# Patient Record
Sex: Female | Born: 1955 | Race: White | Hispanic: No | Marital: Single | Smoking: Never smoker
Health system: Southern US, Community
[De-identification: ages and names within clinical notes are randomized; demographics above are authoritative.]

## PROBLEM LIST (undated history)

## (undated) DIAGNOSIS — F32A Depression, unspecified: Secondary | ICD-10-CM

## (undated) DIAGNOSIS — F329 Major depressive disorder, single episode, unspecified: Secondary | ICD-10-CM

## (undated) HISTORY — PX: DILATION AND CURETTAGE OF UTERUS: SHX78

## (undated) HISTORY — PX: GASTRIC BYPASS: SHX52

## (undated) HISTORY — PX: TONSILLECTOMY: SUR1361

## (undated) HISTORY — PX: REDUCTION MAMMAPLASTY: SUR839

## (undated) HISTORY — PX: ABDOMINAL HYSTERECTOMY: SHX81

## (undated) NOTE — ED Provider Notes (Signed)
 Formatting of this note is different from the original. Emergency Department Note  Message for Patient/Family:  These medical records are being shared as per CMS regulations to improve transparency and understanding. However, these notes have medical terminology and technical language to communicate with other providers, insurance companies, and the billing department.   History   Refer to MDM for HPI  PCP: Per Patient, No Pcp, MD   History reviewed. No pertinent past medical history.  History reviewed. No pertinent surgical history.  Patient's family history was reviewed.  Physical Exam   ED Triage Vitals  Enc Vitals Group     BP 08/15/22 1720 (!) 140/75     Heart Rate 08/15/22 1720 72     Resp 08/15/22 1720 18     Temp 08/15/22 1720 97.5 F (36.4 C)     Temp Source 08/15/22 1720 Temporal     SpO2 08/15/22 1720 95 %     Weight 08/15/22 1718 86.4 kg (190 lb 7.6 oz)     Height 08/15/22 1718 1.676 m (5' 6)     Head Circumference --      Peak Flow --      Pain Score --      Pain Loc --      Pain Edu? --      Excl. in GC? --    Vitals:   08/15/22 1800 08/15/22 1830 08/15/22 1853 08/15/22 1940  BP: 124/80 102/89 126/87 133/78  Patient Position:      Pulse: 68 70  70  Resp: 18 18  18   Temp:    97.5 F (36.4 C)  TempSrc:    Oral  SpO2: 98% 98% 97% 96%  Weight:      Height:       Physical Exam Vitals and nursing note reviewed.  Constitutional:      General: She is not in acute distress.    Appearance: Normal appearance. She is not ill-appearing, toxic-appearing or diaphoretic.  HENT:     Head: Normocephalic and atraumatic.     Nose: No rhinorrhea.  Eyes:     General: No scleral icterus.       Right eye: No discharge.        Left eye: No discharge.     Extraocular Movements: Extraocular movements intact.     Conjunctiva/sclera: Conjunctivae normal.  Neck:     Comments: No midline C-spine tenderness to palpation Cardiovascular:     Rate and Rhythm: Normal  rate and regular rhythm.     Heart sounds: No murmur heard.    No friction rub. No gallop.  Pulmonary:     Effort: Pulmonary effort is normal. No respiratory distress.     Breath sounds: No stridor. No wheezing, rhonchi or rales.  Chest:     Chest wall: No tenderness.  Abdominal:     General: There is no distension.     Palpations: Abdomen is soft.     Tenderness: There is no abdominal tenderness. There is no guarding or rebound.     Hernia: No hernia is present.  Musculoskeletal:        General: Normal range of motion.     Cervical back: Normal range of motion.     Comments: Full range of motion of bilateral upper and lower extremities.  Patient does have some mild tenderness and swelling noted to right dorsal forearm.  Skin:    General: Skin is warm and dry.     Capillary Refill: Capillary refill takes  less than 2 seconds.  Neurological:     General: No focal deficit present.     Mental Status: She is alert.   ED Course  ED ASSESSMENT: I provided the following treatments and reviewed the following labs and imaging studies.  ED Medications:  Patient Medications to Start:  Medications   HYDROcodone-acetaminophen  (Norco) 5-325 MG tablet    Sig: Take 1 tablet by mouth every 6 (six) hours as needed    Dispense:  8 tablet    Refill:  0   Labs: No results found for this visit on 08/15/22.  Radiology: CT Head/Brain Wo Contrast  Final Result  No acute intracranial abnormality.   Leafy Mulder, MD  Electronically Signed: 08/15/2022 6:51 PM   X-ray Wrist right 3 views  Final Result  Minimally displaced fracture of the distal radius with soft tissue edema about the wrist.   Stanislav I. Mcneil, MD  Electronically Signed: 08/15/2022 6:01 PM        MDM   History obtained from: patient   Differential diagnoses includes, not limited to : Fracture, muscular injury, ligamentous injury, intracranial hemorrhage less likely  10 y.o. female presenting to the ED with  mechanical fall earlier today, was bumped at the [place] airport and fell fowrard. Hit her head, bruising noted to jaw but no pain now. Mainly just having pain of R wrist.   No LOC, vomiting. No numbness or weakness in arms or legs.   ED Course: Pt was a trauma alert, CT head obtained as well as xr of right wrist Clears nexus criteria for cspine XR showed acute fracture, placed in volar short arm splint. CT Head showed no acute findings, will plan to discharge  Diagnosis: Wrist fracture  Discharge Medications: Discharge Medication List as of 08/15/2022  7:37 PM    START taking these medications   Details  HYDROcodone-acetaminophen  (Norco) 5-325 MG tablet Take 1 tablet by mouth every 6 (six) hours as needed, Starting Thu 08/15/2022, Until Thu 08/22/2022 at 2359, Normal     Clinical Impression   Diagnosis     Diagnosis Comment Added By Time Added   Closed fracture of distal end of right radius, unspecified fracture morphology, initial encounter  Catharine Battiest, MD 08/15/2022  7:29 PM     ED Dispo  Discharge Discharge  Follow-Up/Referrals: your orthopedic doctor  In 1 week  This note was dictated with the use of Dragon software and is subject to voice recognition errors.  Catharine Battiest, MD Physician 08/23/22 1332  Electronically signed by Catharine Battiest, MD at 08/23/2022  1:32 PM CDT

---

## 1998-06-02 ENCOUNTER — Ambulatory Visit (HOSPITAL_BASED_OUTPATIENT_CLINIC_OR_DEPARTMENT_OTHER): Admission: RE | Admit: 1998-06-02 | Discharge: 1998-06-02 | Payer: Self-pay | Admitting: Orthopedic Surgery

## 1998-08-11 ENCOUNTER — Emergency Department (HOSPITAL_COMMUNITY): Admission: EM | Admit: 1998-08-11 | Discharge: 1998-08-11 | Payer: Self-pay | Admitting: Emergency Medicine

## 2001-11-10 ENCOUNTER — Other Ambulatory Visit: Admission: RE | Admit: 2001-11-10 | Discharge: 2001-11-10 | Payer: Self-pay | Admitting: *Deleted

## 2006-01-14 HISTORY — PX: ERCP: SHX60

## 2006-01-14 HISTORY — PX: GASTRIC BYPASS: SHX52

## 2006-09-19 ENCOUNTER — Encounter: Admission: RE | Admit: 2006-09-19 | Discharge: 2006-09-19 | Payer: Self-pay | Admitting: General Surgery

## 2006-12-15 ENCOUNTER — Inpatient Hospital Stay (HOSPITAL_COMMUNITY): Admission: RE | Admit: 2006-12-15 | Discharge: 2006-12-17 | Payer: Self-pay | Admitting: General Surgery

## 2006-12-16 ENCOUNTER — Ambulatory Visit: Payer: Self-pay | Admitting: Vascular Surgery

## 2006-12-22 ENCOUNTER — Encounter: Admission: RE | Admit: 2006-12-22 | Discharge: 2007-03-22 | Payer: Self-pay | Admitting: General Surgery

## 2007-01-15 HISTORY — PX: CHOLECYSTECTOMY: SHX55

## 2007-05-21 ENCOUNTER — Encounter: Admission: RE | Admit: 2007-05-21 | Discharge: 2007-05-21 | Payer: Self-pay | Admitting: General Surgery

## 2007-10-15 ENCOUNTER — Emergency Department (HOSPITAL_COMMUNITY): Admission: EM | Admit: 2007-10-15 | Discharge: 2007-10-15 | Payer: Self-pay | Admitting: Emergency Medicine

## 2007-10-16 ENCOUNTER — Observation Stay (HOSPITAL_COMMUNITY): Admission: AD | Admit: 2007-10-16 | Discharge: 2007-10-18 | Payer: Self-pay | Admitting: General Surgery

## 2007-10-17 ENCOUNTER — Encounter (INDEPENDENT_AMBULATORY_CARE_PROVIDER_SITE_OTHER): Payer: Self-pay | Admitting: Surgery

## 2009-01-25 IMAGING — CR DG CHEST 2V
2 series · 2 of 2 positions shown · non-contrast
Comparison: No priors for comparison.

CLINICAL DATA: Morbid obesity.  HBP.  Preop workup.
CHEST - 2 VIEW:

[w chest pa *]
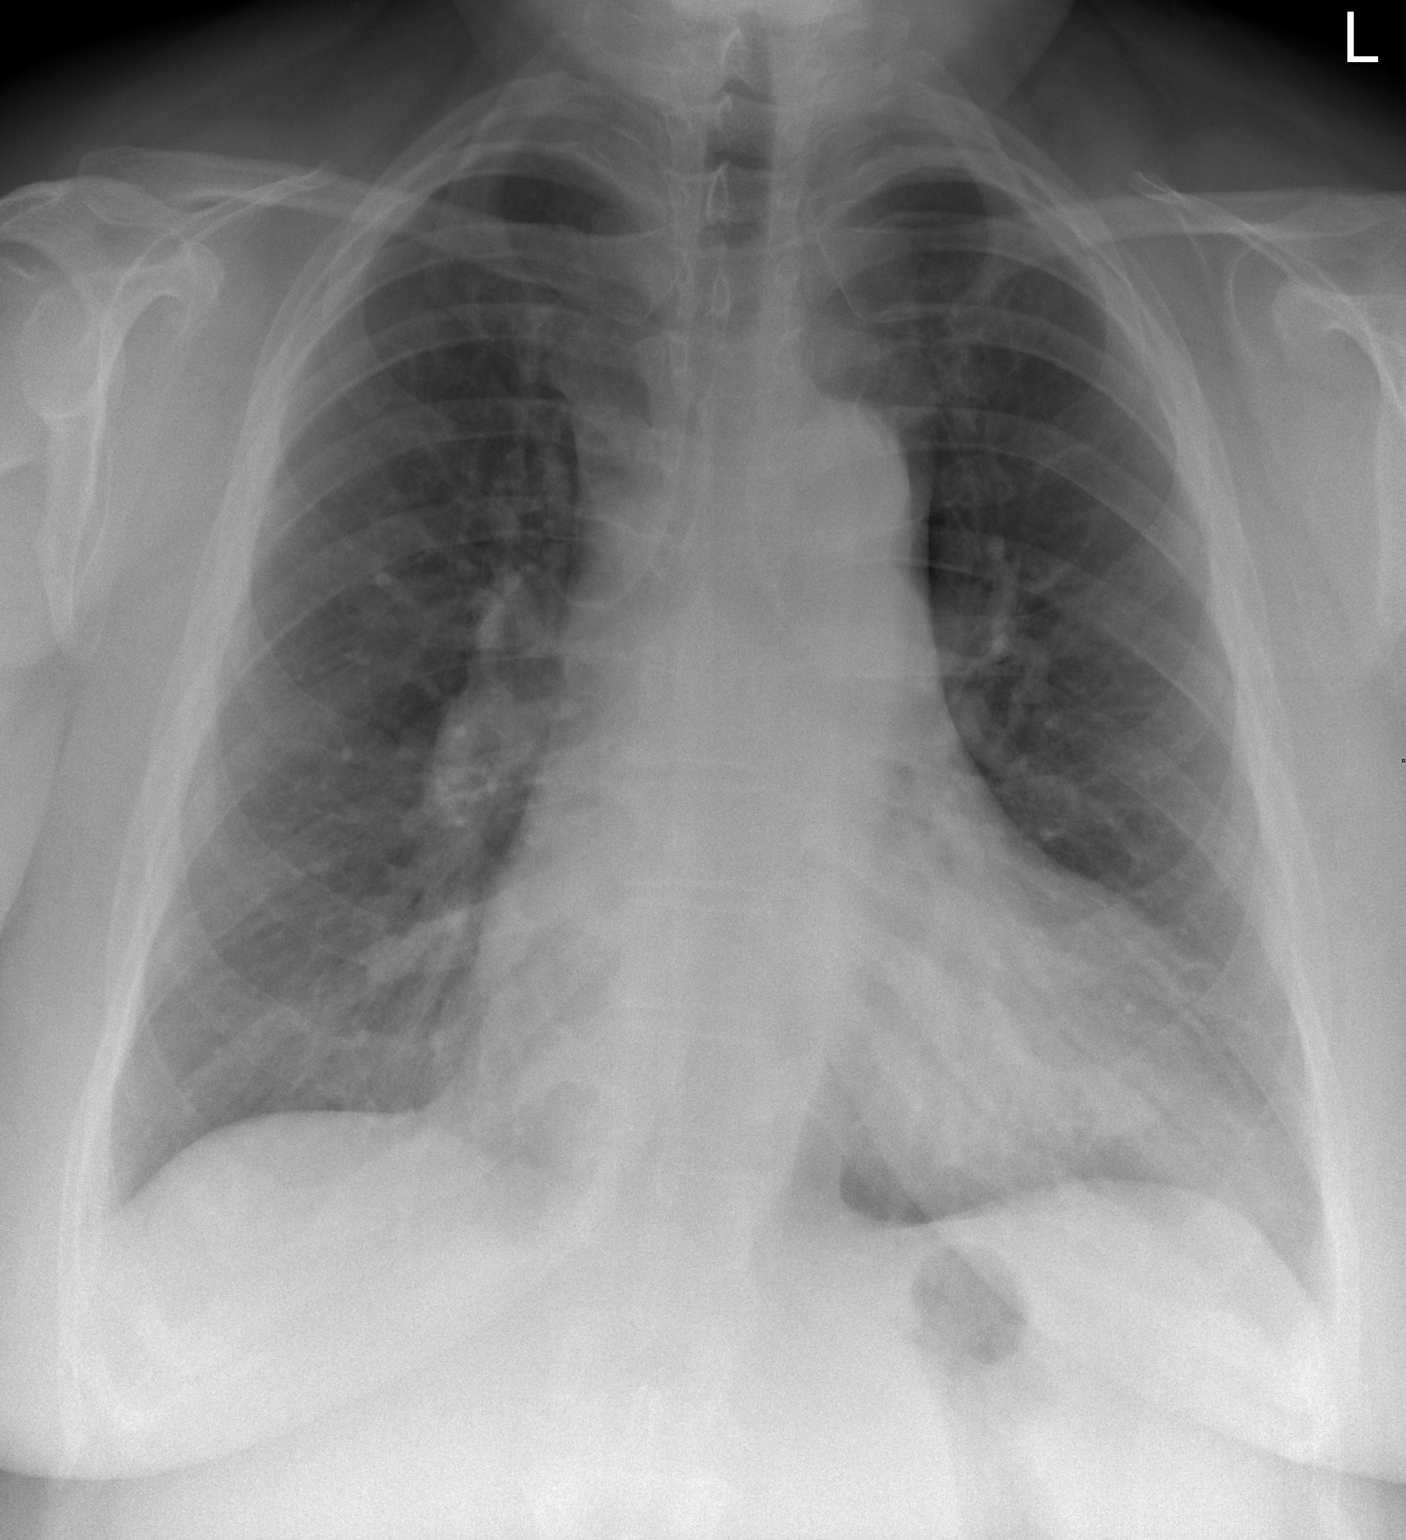

[w chest lat *]
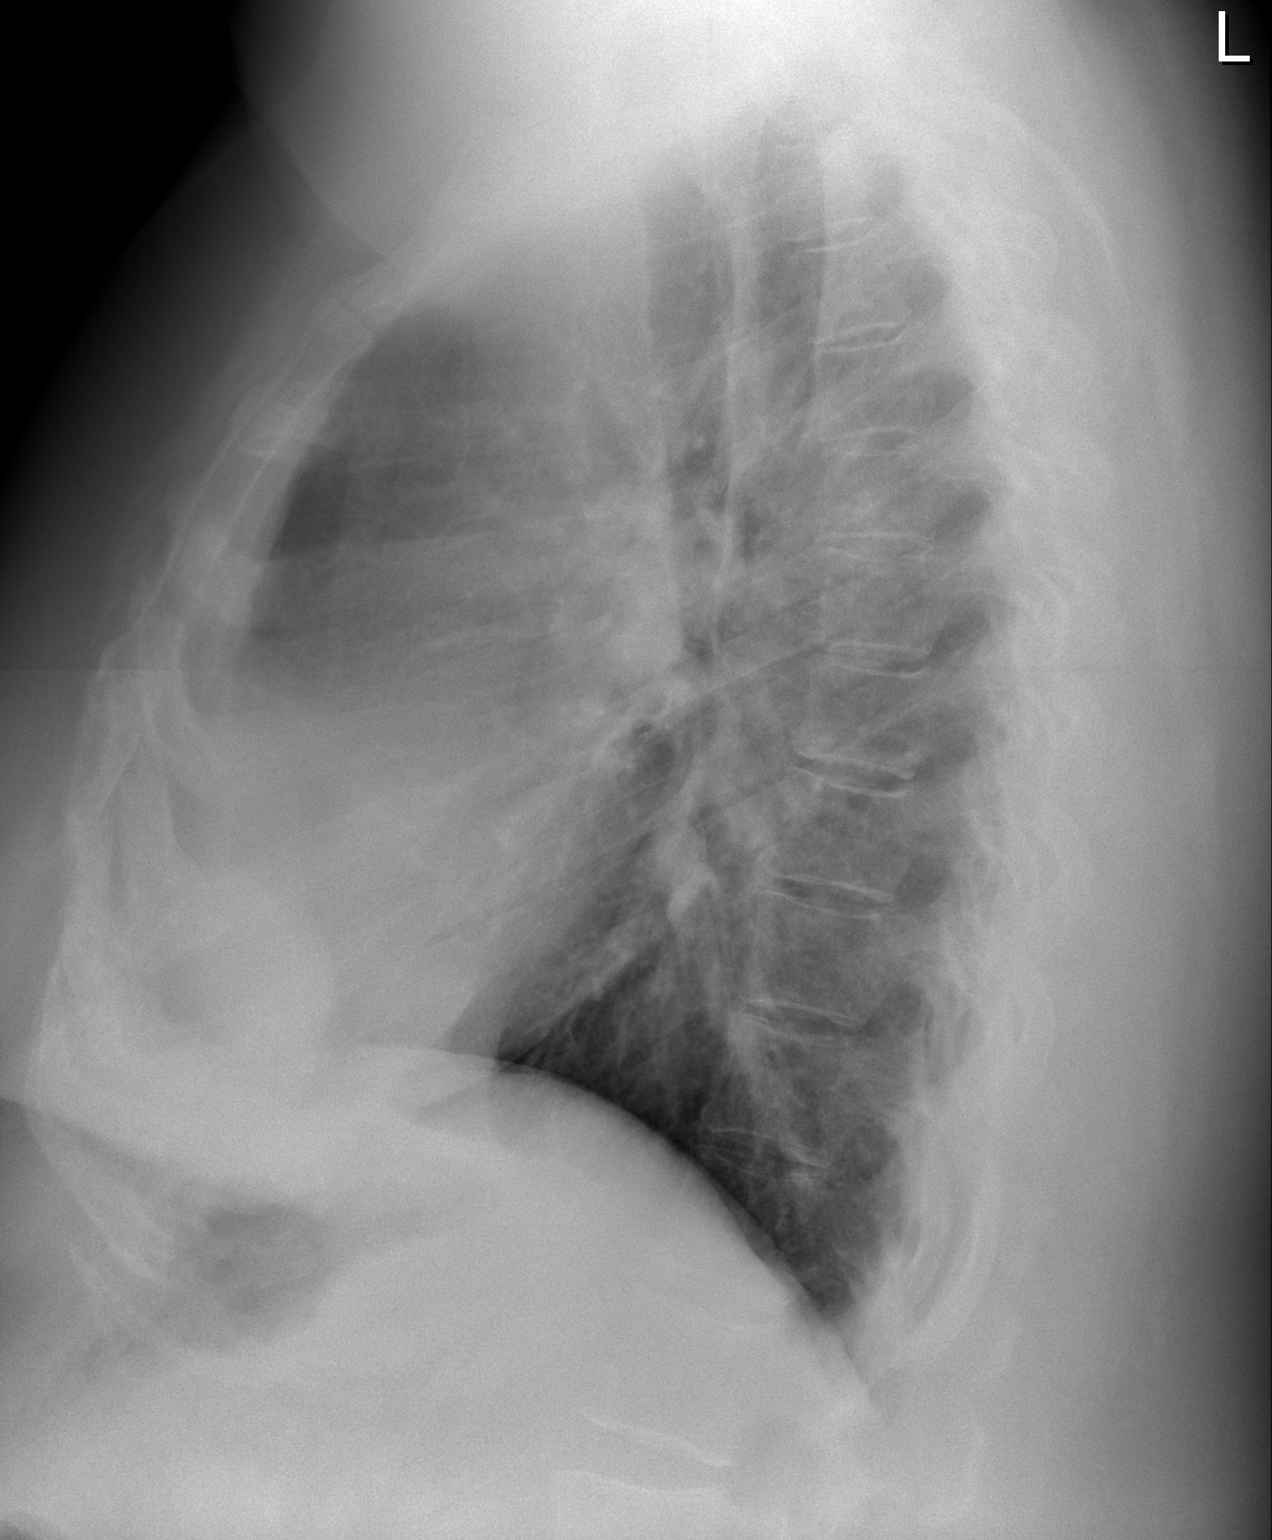

[2 of 2 positions shown; findings below may reference images not displayed]

FINDINGS: Cardiomegaly.  Redistribution of pulmonary blood flow of the upper lung to the upper lung zones compatible with pulmonary venous hypertension.  No pulmonary edema.  Lungs moderately hyperaerated.
IMPRESSION: Cardiomegaly and pulmonary venous hypertension.  Hyperaerated lungs.

## 2009-02-14 HISTORY — PX: HIP ARTHROPLASTY: SHX981

## 2009-03-03 ENCOUNTER — Inpatient Hospital Stay (HOSPITAL_COMMUNITY): Admission: EM | Admit: 2009-03-03 | Discharge: 2009-03-04 | Payer: Self-pay | Admitting: Emergency Medicine

## 2010-04-04 LAB — DIFFERENTIAL
Eosinophils Relative: 1 % (ref 0–5)
Lymphocytes Relative: 26 % (ref 12–46)
Lymphs Abs: 1.7 10*3/uL (ref 0.7–4.0)
Neutro Abs: 4.2 10*3/uL (ref 1.7–7.7)

## 2010-04-04 LAB — URINALYSIS, ROUTINE W REFLEX MICROSCOPIC
Glucose, UA: NEGATIVE mg/dL
Hgb urine dipstick: NEGATIVE
Nitrite: NEGATIVE
Protein, ur: NEGATIVE mg/dL
Urobilinogen, UA: 0.2 mg/dL (ref 0.0–1.0)

## 2010-04-04 LAB — APTT: aPTT: 32 seconds (ref 24–37)

## 2010-04-04 LAB — POCT I-STAT, CHEM 8
BUN: 11 mg/dL (ref 6–23)
Calcium, Ion: 1.17 mmol/L (ref 1.12–1.32)
Chloride: 106 mEq/L (ref 96–112)
Hemoglobin: 15 g/dL (ref 12.0–15.0)
Potassium: 3.6 mEq/L (ref 3.5–5.1)
TCO2: 28 mmol/L (ref 0–100)

## 2010-04-04 LAB — BASIC METABOLIC PANEL
BUN: 7 mg/dL (ref 6–23)
Calcium: 9 mg/dL (ref 8.4–10.5)
Creatinine, Ser: 0.47 mg/dL (ref 0.4–1.2)
GFR calc Af Amer: >60 mL/min (ref >60)
GFR calc non Af Amer: >60 mL/min (ref >60)

## 2010-04-04 LAB — CBC
Hemoglobin: 15 g/dL (ref 12.0–15.0)
MCHC: 34.8 g/dL (ref 30.0–36.0)
Platelets: 256 10*3/uL (ref 150–400)
RBC: 4.33 MIL/uL (ref 3.87–5.11)
RBC: 5.04 MIL/uL (ref 3.87–5.11)
WBC: 9.3 10*3/uL (ref 4.0–10.5)

## 2010-04-04 LAB — PROTIME-INR: Prothrombin Time: 13.2 seconds (ref 11.6–15.2)

## 2010-04-04 LAB — GLUCOSE, CAPILLARY: Glucose-Capillary: 101 mg/dL — ABNORMAL HIGH (ref 70–99)

## 2010-05-29 NOTE — Op Note (Signed)
NAMEBRITTIANY, Hannah Cabrera             ACCOUNT NO.:  [id number]   MEDICAL RECORD NO.:  [id number]          PATIENT TYPE:  INP   LOCATION:  0003                         FACILITY:  [facility]   PHYSICIAN:  Sharlet Salina T. Hoxworth, M.D.DATE OF BIRTH:  1955/11/03   DATE OF PROCEDURE:  12/15/2006  DATE OF DISCHARGE:                               OPERATIVE REPORT   PREOPERATIVE DIAGNOSES:  Morbid obesity.   POSTOPERATIVE DIAGNOSIS:  Morbid obesity.   SURGICAL PROCEDURES:  Laparoscopic Roux-en-Y gastric bypass.   SURGEON:  Lorne Skeens. Hoxworth, M.D.   ASSISTANT:  Sandria Bales. Ezzard Standing, M.D.   ANESTHESIA:  General.   BRIEF HISTORY:  Hannah Cabrera is a 55 year old female with a long history  of progressive morbid obesity and multiple comorbidities unresponsive to  medical management.  Following extensive discussion detailed elsewhere,  we have elected proceed with laparoscopic Roux-en-Y gastric bypass.  The  patient was brought to the operating room for this procedure.   DESCRIPTION OF OPERATION:  The patient brought to the operating room,  placed in the supine position on the operating table and general  endotracheal anesthesia was induced.  She had undergone mechanical  antibiotic bowel prep.  Lovenox 40 mg had been given subcutaneously.  Preoperative antibiotics were administered.  PAS were placed.  The  abdomen was widely sterilely prepped and draped.  Local anesthesia was  used to infiltrate the trocar sites.  Abdominal access was obtained with  11 mm OptiVu trocar in the left subcostal space and pneumoperitoneum  established.  Under direct vision the 11 mm trocar was placed in the  right subcostal space to center the falciform ligament, another in the  right upper mid abdomen, another in the left upper mid abdomen for the  camera port and a 5 mm trocar placed in the left flank.  The patient had  a previous TAH-BSO and there were omental adhesions to the anterior  abdominal wall that were taken down  without difficulty with the harmonic  scalpel.  The omentum and transverse colon were elevated.  The ligament  of Treitz clearly identified.  A 40 cm afferent limb was measured and  the small bowel divided at this point with a single firing of the white  load 45 mm stapler.  The mesentery was further mobilized a short  distance with the harmonic scalpel.  The end of the Roux limb was marked  suturing a Penrose drain to it and then a 100 cm Roux limb was carefully  measured.  At this point a side-to-side jejunojejunostomy was created  with a single firing of the white load 45 mm stapler.  The staple line  was inspected and was intact without bleeding.  The common enterotomy  was closed with running 2-0 Vicryl beginning at either end of the  enterotomy and tied centrally.  The mesenteric defect was closed with a  running 2-0 silk.  Tisseel had been used to coat the staple and the  suture lines.  At this point the patient was placed in reverse  Trendelenburg and through a 5-mm site subxiphoid a liver retractor was  placed and the  left lobe of the liver elevated.  The liver was large and  somewhat floppy but adequate exposure was obtained with good  visualization of the hiatus of her stomach.  The angle of His was  mobilized with harmonic scalpel.  An approximately 4 to 5 cm the pouch  was then measured along the lesser curve and the lesser curve was  dissected along the gastric wall back toward the lesser sac with the  harmonic scalpel and then the free lesser sac entered without  difficulty.  All tubes were assured to be removed from the stomach and  initial firing of the gold 60 mm echelon stapler was used at right-angle  along the lesser curve about 40 cm.  Two additional firings of the blue  load stapler were then used creating a tubular pouch up through the  angle of His and the stomach was seen to be completely divided.  The  gastric remnant staple line was oversewn with running 2-0  silk.  Tisseel  sealant was used to the coat the upper portion of the staple line of the  pouch.  The omentum was divided with the harmonic scalpel to allow the  Roux limb to come up more easily under no tension which it did very  nicely.  The Roux limb was brought up and initial posterior row of  running 2-0 Vicryl was placed between the Roux limb of the gastric  pouch.  Enterotomies were then created in the pouch of the Roux limb  with harmonic scalpel and approximately 2 cm anastomosis was then  created with a single firing of the blue load 45-mm linear stapler.  Staple line was inspected and was intact without bleeding.  The common  enterotomy was then closed beginning at either end with a running 2-0  Vicryl.  The Ewald tube was then passed back down through the  anastomosis and an outer layer of seromuscular Lembert running 2-0  Vicryl was placed.  The anastomosis appeared to be under no tension with  very good blood supply.  Following this with the gastric outlet clamped  and the anastomosis under saline irrigation, Dr. Ezzard Standing performed  intraoperative endoscopy and with the pouch tightly distended with air  there was no evidence of leak.  The pouch was decompressed.  Complete  hemostasis was assured.  The abdomen irrigated and suctioned.  Tisseel  tissue sealant was used to cut the sutured staple lines of the  gastrojejunostomy.  Following this all CO2 was evacuated.  Trocars  removed.  Skin incisions were closed with interrupted subcuticular 4-0  Monocryl and Dermabond.  Sponge and needle counts correct.  The patient  taken to recovery room in good condition.      Lorne Skeens. Hoxworth, M.D.  Electronically Signed     BTH/MEDQ  D:  12/15/2006  T:  12/15/2006  Job:  [id number]

## 2010-05-29 NOTE — Op Note (Signed)
NAME:  Hannah, Cabrera NO.:  [id number]   MEDICAL RECORD NO.:  [id number]          PATIENT TYPE:  INP   LOCATION:  1522                         FACILITY:  [facility]   PHYSICIAN:  Ardeth Sportsman, MD     DATE OF BIRTH:  05-01-1955   DATE OF PROCEDURE:  10/17/2007  DATE OF DISCHARGE:  10/18/2007                               OPERATIVE REPORT   PRIMARY CARE PHYSICIAN:  Dr. Cherly Hensen.   SURGEON:  Ardeth Sportsman, MD   ASSISTANT:  RN first assist.   PREOPERATIVE DIAGNOSES:  1. Abdominal pain, probable chronic cholecystitis.  2. Status post laparoscopic Roux-en-Y gastric bypass rule out possible      internal hernia.   HISTORY OF PRESENT ILLNESS:  Ms. Spiewak is a pleasant 55 year old  female morbidly obese who has had a 120 pound weight loss after her  laparoscopic Roux-en-Y gastric bypass on December 15, 2006, by Dr.  Glenna Fellows.  She did relatively well until she had an episode of  severe abdominal pain 48 hours ago.  Went to the emergency room and  followed up with Dr. Johna Sheriff the next day.  She seemed more tender in  the right upper quadrant and he was concerned for possible  cholecystitis.  Initial CAT scan had been unrevealing.  She was admitted  to the hospital and a followup ultrasound was more consistent with  cholecystitis.  Because of the severe episode of paroxysmal pain  recommendation was made for diagnostic laparoscopy with probable  laparoscopic cholecystectomy.  Given the prior Roux-en-Y gastric bypass  even though internal defects had been sewn off as dictated by Dr.  Johna Sheriff recommendation was made for diagnostic laparoscopy to make sure  there were no internal hernias or any other abnormalities.  Technique of  the procedure were discussed, risks, benefits and alternatives and  questions answered.  She agreed to proceed.   OPERATIVE FINDINGS:  She had obvious gallbladder wall thickening with  moderate omental and mesocolonic adhesions  consistent with acute  cholecystitis.  Her liver appeared to be normal.  There was no evidence  of any internal hernias or mesenteric defects from her Roux-en-Y gastric  bypass.  Her small bowel otherwise looked normal.   DESCRIPTION OF PROCEDURE:  Informed consent was confirmed.  The patient  received IV cefoxitin just prior to surgery.  She voided just prior to  the operating room.  She had sequential compression devices active  during the entire case.  She underwent general anesthesia without any  difficulty.  She was positioned supine with both arms tucked.  Her  abdomen was prepped and draped in a sterile fashion.   A 5 mm port was placed in the right upper quadrant using optimal entry  technique with the patient in steep reverse Trendelenburg and right side  up.  Camera inspection revealed no intra-abdominal injury and obvious  inflamed gallbladder with omental adhesions to it.  Under direct  visualization a 5 mm port was placed in the right flank and another one  was placed infraumbilically through a prior incision.  A 10 mm port was  placed  in the left upper quadrant through a prior 10 mm incision.   The dense acute adhesions of omentum and mesocolon were carefully freed  off the gallbladder from the dome side down using care to dissect out  and use focused cautery only.  The gallbladder was very distended and  was not able to grasp so therefore I did needle compression on it and  evacuated very dark thickened bile.  I did not need to release any  stones.  The gallbladder could be better grasped and elevated cephalad.  With this I was able to do further sweeping of the mesocolonic and  omental adhesions down around the infundibulum.   Peritoneal coverings to the liver, gallbladder fossa and the  anteromedial and posterolateral walls of the gallbladder were freed off.  Circumferential dissection was done to free the proximal third of the  gallbladder off the liver bed to get a  good critical view.  An obvious  large anterior cystic arterial branch was seen on the anterior wall of  the gallbladder.  This was skeletonized and isolated.  It was clipped  and divided staying very high on the gallbladder.  Further dissection  was done and a posterior branch was encountered with a little bit of  bleeding that was isolated and controlled with clips as well.  Further  dissection was done to help skeletonize lymphatics and venous staying  high around the infundibulum such that there was only one structure  going from infundibulum of the gallbladder down the porta hepatis  consistent with the cystic duct.   Two clips were placed in the infundibulum and a partial cyst ductotomy  was performed.  A 5 [place] cholangiocatheter was placed through a right  subcostal stab incision and placed in the cystic duct.  Cholangiogram  was run using dilute radio-opaque contrast and continuous fluoroscopy.  Contrast flowed well from a helical side branch consistent with cystic  duct cannulization.  It entered into a common hepatic duct and refluxed  in the right and left intrahepatic bile ducts very well.  It flowed down  the common bile duct.  There was a mild delay but eventually it popped  through the ampulla a smooth ampulla into a normal duodenum as well.  I  looked at the rereviews and there was no evidence of any reverse  meniscus factor or any lucencies concerning for defects or ulcerations  or other abnormalities.  The cholangiocatheter was removed.  Four clips  were placed on the long cystic duct and cystic transection was  completed.   The gallbladder was freed from its main attachments on the liver bed.  It was placed in an EndoCatch bag.  I did have to open up the incision  in the left upper quadrant to get the gallbladder out as it was very  thick and dilated and full of some stones.  With that I did run into a  muscle wall bleeder that I was able to control by using zero  Vicryl  figure-of-eight stitches using laparoscopic intracorporeal suturing as  well as a zero Vicryl on a UR6.  Copious irrigation was done over 3  liters with clear return.  Hemostasis was ensured on the liver bed.  Clips on the cystic duct and arterial branches were intact.   Next, inspection was made of the Roux-en-Y gastric bypass.  I went ahead  and ran the small bowel from the ileocecal valve proximally.  I came to  the jejunojejunostomy.  I followed the  Roux limb and easily came up  below the left lateral sector of the liver into the small gastric pouch.  I came back down to the jejunojejunostomy and ran the shorter  biliopancreatic limb and came to the ligament of Treitz well.  I  inspected the mesentery and it was well secured down on the  gastrojejunal limb and the biliopancreatic limb as well as in the common  channel with no evidence of any internal defects.  There was no evidence  of any bowel obstruction or abnormalities in the mesentery.  The camera  inspection revealed no other abnormalities as well.   I went ahead and reinspected and hemostasis was again good on the liver  bed as well as in the anterior abdominal wall.  Capnoperitoneum  evacuated.  I went ahead and removed the left upper quadrant port and  tied the fascial stitches down.  I reinsufflated and reinspected from  the inside and hemostasis was excellent.  Hemostasis was good from the  skin side as well.  Capnoperitoneum was again evacuated and all the rest  of the ports were removed.  The skin was using 4-0 Monocryl stitch.  Sterile dressing was applied.  The patient was extubated and sent to the  recovery room in stable condition.   Per the patient's request I am going to try and reach her friend via her  cell phone since I do not think there is any family immediately  available.      Ardeth Sportsman, MD  Electronically Signed     SCG/MEDQ  D:  10/17/2007  T:  10/18/2007  Job:  [id number]

## 2010-05-29 NOTE — H&P (Signed)
NAMELOVINIA, SNARE NO.:  [id number]   MEDICAL RECORD NO.:  [id number]          PATIENT TYPE:  INP   LOCATION:  1522                         FACILITY:  [facility]   PHYSICIAN:  Sharlet Salina T. Hoxworth, M.D.DATE OF BIRTH:  1955/06/22   DATE OF ADMISSION:  10/16/2007  DATE OF DISCHARGE:  10/18/2007                              HISTORY & PHYSICAL   CHIEF COMPLAINT:  Abdominal pain.   HISTORY OF PRESENT ILLNESS:  Hannah Cabrera is a 55 year old female who  is 10 months status post laparoscopic Roux-en-Y gastric bypass for  morbid obesity.  She has lost 110 pounds from preop of 283 to currently  173.  She has done well with no abdominal or GI complaints until this  point.  Yesterday about, 5 hours after eating lunch, she developed the  fairly sudden onset of pain which initially was felt in her upper back  between her shoulder blades and then across her upper abdomen.  The pain  became very severe over about a 2-hour period.  She presented to [facility] Emergency Room.  There, she was evaluated and treated.  Lab work  including CBC, liver chemistries, and electrolytes were all normal.  A  CT scan of the abdomen and pelvis was unremarkable for any acute  abnormalities.  The gallbladder appeared distended but no other  abnormalities were seen.  The pain improved with treatment and she was  discharged.  However, today she remains uncomfortable.  The pain is not  severe but she remains quite sore, which she feels across her upper  abdomen bilaterally in the upper quadrants.  She remains nauseated  without vomiting.  Again, she has no history of any previous similar  problems.   PAST MEDICAL HISTORY:  Surgery significant mainly for Roux-en-Y gastric  bypass as above.  She has also had breast reduction, tonsillectomy, TAH-  BSO.  Medically she has a history of sleep apnea requiring CPAP, adult  onset diabetes mellitus, and hypertension all resolved following weight  loss.   She is currently only treated for mild depression.   MEDICATIONS:  1. Prozac 20 daily.  2. Occasional claritin for sinus.   ALLERGIES:  SULFA.   SOCIAL HISTORY:  She is a Equities trader.  Does not smoke  cigarettes or drink alcohol.   FAMILY HISTORY:  Noncontributory.   REVIEW OF SYSTEMS:  All negative except GI as above.   PHYSICAL EXAM:  VITAL SIGNS:  Temperature is 99.8, pulse 68,  respirations 18, blood pressure 144/78.  GENERAL:  Well developed, uncomfortable but in no acute distress.  SKIN:  She has a mild rash over her trunk consistent with known  psoriasis.  LYMPH NODES:  None palpable.  LUNGS:  Clear without wheezing or increased work of breathing.  CARDIAC:  Regular rate and rhythm.  No murmurs.  ABDOMEN:  Nondistended.  Well-healed incisions.  No hernias.  There is  tenderness fairly well localized in the right upper quadrant with some  guarding and mild tenderness in the left upper quadrant.  No masses,  hepatomegaly, or fullness.  EXTREMITIES:  No joint swelling or  deformity.  NEUROLOGIC:  Alert, oriented.  Gait normal.   LABORATORY:  CT scan as above.   ASSESSMENT AND PLAN:  Acute abdominal pain in patient 10 months  following a Roux-en-Y gastric bypass.  Clinically I suspect this could  be cholecystitis.  Cannot rule out internal hernia despite negative CT  scan.  The patient will be admitted to [facility] and will  obtain a stat ultrasound of the gallbladder.  She will require  cholecystectomy, of course, if her gallbladder ultrasound is positive,  but if this is negative, she will still require laparoscopy to evaluate  for a possible internal hernia.  This was all discussed and the patient  understands.      Lorne Skeens. Hoxworth, M.D.  Electronically Signed     BTH/MEDQ  D:  10/16/2007  T:  10/18/2007  Job:  [id number]

## 2010-05-29 NOTE — Op Note (Signed)
NAMEJAILIN, MANOCCHIO NO.:  [id number]   MEDICAL RECORD NO.:  [id number]          PATIENT TYPE:  INP   LOCATION:  0003                         FACILITY:  [facility]   PHYSICIAN:  Sandria Bales. Ezzard Standing, M.D.  DATE OF BIRTH:  01/08/1956   DATE OF PROCEDURE:  12/15/2006  DATE OF DISCHARGE:                               OPERATIVE REPORT   PREOPERATIVE DIAGNOSIS:  Morbid obesity.   POSTOPERATIVE DIAGNOSIS:  Morbid morbid obesity status post laparoscopic  Roux-en-Y gastric bypass.   PROCEDURE:  Esophagogastrojejunoscopy.   SURGEON:  Sandria Bales. Ezzard Standing, M.D.   ANESTHESIA:  General endotracheal.   BLOOD LOSS:  None.   PROCEDURE:  Ms Harnden is a 55 year old female who has undergone a  laparoscopic Roux-en-Y gastric bypass by Dr. Jaclynn Guarneri.  This surgery  was done for morbid obesity.   I am doing an upper endoscopy to document pouch size, evaluate for leak,  and mucosal integrity.   PROCEDURE NOTE:  The patient is in a supine position.  Dr. Johna Sheriff is  manning the laparoscope and flooding the upper abdomen with saline.   I passed a flexible Olympus endoscope down into her gastric pouch.  Identified the gastrojejunal anastomosis at 50 cm.  This was widely  patent.  I insufflated air while Dr. Johna Sheriff clamped off the small  bowel and he saw no bubbling or evidence of leak.  Anastomosis was at  least 1.5 cm to 2 cm in diameter.  There was no bleeding at the  anastomotic site.  The staple line looked good and the mucosa was  healthy.  I then withdrew the scope back to the esophagogastric junction  which is 45 cm for 5 cm pouch.   The esophagus was otherwise unremarkable.  I then withdrew the scope.  The patient tolerated the procedure well.  Dr. Johna Sheriff will dictate the  remainder of the operation.      Sandria Bales. Ezzard Standing, M.D.  Electronically Signed     DHN/MEDQ  D:  12/15/2006  T:  12/15/2006  Job:  [id number]   cc:   Lorne Skeens. Hoxworth, M.D.  [address],  [address]  [place]  [place] [address]

## 2010-10-15 LAB — CBC
HCT: 44.4
Hemoglobin: 14.9
MCHC: 33.5
MCV: 87.9
Platelets: 307
RBC: 5.06
RDW: 12.9
WBC: 9.1

## 2010-10-15 LAB — URINALYSIS, ROUTINE W REFLEX MICROSCOPIC
Bilirubin Urine: NEGATIVE
Glucose, UA: NEGATIVE
Hgb urine dipstick: NEGATIVE
Ketones, ur: 15 — AB
Nitrite: NEGATIVE
Protein, ur: NEGATIVE
Specific Gravity, Urine: 1.017
Urobilinogen, UA: 0.2
pH: 7.5

## 2010-10-15 LAB — HEPATIC FUNCTION PANEL
ALT: 34
AST: 25
Albumin: 4.2
Total Protein: 7

## 2010-10-15 LAB — POCT I-STAT, CHEM 8
BUN: 22
Calcium, Ion: 1.07 — ABNORMAL LOW
Creatinine, Ser: 0.8
HCT: 42
Sodium: 139

## 2010-10-15 LAB — DIFFERENTIAL
Basophils Absolute: 0.1
Basophils Relative: 1
Monocytes Absolute: 0.6
Neutro Abs: 4
Neutrophils Relative %: 44

## 2010-10-22 LAB — CBC
HCT: 40
HCT: 40.6
Hemoglobin: 14.1
Hemoglobin: 14.3
MCHC: 35.2
MCV: 83
MCV: 83
Platelets: 325
RBC: 4.9
RDW: 14.2
RDW: 14.3
WBC: 12.9 — ABNORMAL HIGH

## 2010-10-22 LAB — DIFFERENTIAL
Basophils Absolute: 0.1
Basophils Relative: 0
Eosinophils Absolute: 0 — ABNORMAL LOW
Eosinophils Absolute: 0.1 — ABNORMAL LOW
Eosinophils Relative: 0
Eosinophils Relative: 1
Lymphocytes Relative: 11 — ABNORMAL LOW
Lymphs Abs: 1.4
Monocytes Absolute: 1
Monocytes Absolute: 1.2 — ABNORMAL HIGH
Monocytes Relative: 10

## 2010-10-22 LAB — HEMOGLOBIN AND HEMATOCRIT, BLOOD
HCT: 41.9
Hemoglobin: 14.9

## 2010-10-23 LAB — BASIC METABOLIC PANEL
BUN: 15
Calcium: 9.5
Creatinine, Ser: 0.66
GFR calc non Af Amer: >60
Glucose, Bld: 122 — ABNORMAL HIGH
Potassium: 3.7

## 2010-10-23 LAB — HEMOGLOBIN AND HEMATOCRIT, BLOOD: Hemoglobin: 14

## 2010-10-23 LAB — URINALYSIS, ROUTINE W REFLEX MICROSCOPIC
Bilirubin Urine: NEGATIVE
Glucose, UA: NEGATIVE
Hgb urine dipstick: NEGATIVE
Protein, ur: NEGATIVE

## 2010-12-08 ENCOUNTER — Encounter: Payer: Self-pay | Admitting: Adult Health

## 2010-12-08 ENCOUNTER — Emergency Department (HOSPITAL_COMMUNITY): Payer: BC Managed Care – PPO

## 2010-12-08 ENCOUNTER — Emergency Department (HOSPITAL_COMMUNITY)
Admission: EM | Admit: 2010-12-08 | Discharge: 2010-12-08 | Disposition: A | Discharge status: Home / Self Care | Payer: BC Managed Care – PPO | Attending: Emergency Medicine | Admitting: Emergency Medicine

## 2010-12-08 DIAGNOSIS — Z79899 Other long term (current) drug therapy: Secondary | ICD-10-CM | POA: Insufficient documentation

## 2010-12-08 DIAGNOSIS — S42309A Unspecified fracture of shaft of humerus, unspecified arm, initial encounter for closed fracture: Secondary | ICD-10-CM

## 2010-12-08 DIAGNOSIS — W108XXA Fall (on) (from) other stairs and steps, initial encounter: Secondary | ICD-10-CM | POA: Insufficient documentation

## 2010-12-08 DIAGNOSIS — F329 Major depressive disorder, single episode, unspecified: Secondary | ICD-10-CM | POA: Insufficient documentation

## 2010-12-08 DIAGNOSIS — F3289 Other specified depressive episodes: Secondary | ICD-10-CM | POA: Insufficient documentation

## 2010-12-08 HISTORY — DX: Depression, unspecified: F32.A

## 2010-12-08 HISTORY — DX: Major depressive disorder, single episode, unspecified: F32.9

## 2010-12-08 MED ORDER — HYDROMORPHONE HCL PF 1 MG/ML IJ SOLN
1.0000 mg | Freq: Once | INTRAMUSCULAR | Status: AC
  Administered 2010-12-08: 1 mg via INTRAMUSCULAR
  Filled 2010-12-08: qty 1

## 2010-12-08 NOTE — ED Provider Notes (Signed)
History     CSN: [id number] Arrival date & time: 12/08/2010  5:42 PM   First MD Initiated Contact with Patient 12/08/10 1755      Chief Complaint  Patient presents with  . Fall  . Arm Injury    (Consider location/radiation/quality/duration/timing/severity/associated sxs/prior treatment) Patient is a 55 y.o. female presenting with fall and arm injury. The history is provided by the patient.  Fall The accident occurred 1 to 2 hours ago. Incident: She tripped while walking up stairs, landing on left arm. Injury isolated to upper arm.  The pain is severe. She was ambulatory at the scene. There was no alcohol use involved in the accident. Pertinent negatives include no fever and no abdominal pain. Associated symptoms comments: No head injury, or neck pain..  Arm Injury  Pertinent negatives include no abdominal pain.    Past Medical History  Diagnosis Date  . Depression     Past Surgical History  Procedure Date  . Gastric bypass     History reviewed. No pertinent family history.  History  Substance Use Topics  . Smoking status: Never Smoker   . Smokeless tobacco: Not on file  . Alcohol Use: No    OB History    Grav Para Term Preterm Abortions TAB SAB Ect Mult Living                  Review of Systems  Constitutional: Negative for fever and chills.  HENT: Negative.   Respiratory: Negative.   Cardiovascular: Negative.   Gastrointestinal: Negative.  Negative for abdominal pain.  Musculoskeletal:       See HPI.  Skin: Negative.   Neurological: Negative.     Allergies  Nsaids and Sulfa antibiotics  Home Medications   Current Outpatient Rx  Name Route Sig Dispense Refill  . BUPROPION HCL ER (SR) 150 MG PO TB12 Oral Take 150 mg by mouth daily.      Marland Kitchen CITRACAL PETITES/VITAMIN D PO Oral Take 2 tablets by mouth daily.      Marland Kitchen VITAMIN D 1000 UNITS PO TABS Oral Take 1,000 Units by mouth daily.      Marland Kitchen ESCITALOPRAM OXALATE 10 MG PO TABS Oral Take 10 mg by mouth daily.       Carma Leaven M PLUS PO TABS Oral Take 2 tablets by mouth daily.      Marland Kitchen VITAMIN B-12 1000 MCG PO TABS Oral Take 1,000 mcg by mouth daily.        BP 131/74  Pulse 68  Temp(Src) 98.1 F (36.7 C) (Oral)  Resp 20  SpO2 98%  Physical Exam  Constitutional: She appears well-developed and well-nourished.  HENT:  Head: Normocephalic.  Neck: Normal range of motion. Neck supple.  Cardiovascular: Normal rate and regular rhythm.   Pulmonary/Chest: Effort normal and breath sounds normal. She exhibits no tenderness.  Abdominal: Soft. Bowel sounds are normal. There is no tenderness. There is no rebound and no guarding.  Musculoskeletal: She exhibits no edema.       Left arm tender over mid-humerus without significant deformity. Shoulder and elbow nontender. Distal pulses are 2+, neurosensory is intact.  Neurological: She is alert. No cranial nerve deficit.  Skin: Skin is warm and dry. No rash noted.  Psychiatric: She has a normal mood and affect.    ED Course  Procedures (including critical care time)  Labs Reviewed - No data to display Dg Humerus Left  12/08/2010  *RADIOLOGY REPORT*  Clinical Data: 55 year old female with left arm  pain following fall.  LEFT HUMERUS - 2+ VIEW  Comparison: None  Findings: An oblique fracture of the proximal - mid humerus is noted with up to 2 cm anterior - medial displacement.  This fracture extends to the humeral neck. There is no evidence of subluxation or dislocation.  IMPRESSION: Oblique fracture of the proximal - mid humerus with up to 2 cm anterior-medial displacement.  Original Report Authenticated By: Rosendo Gros, M.D.     No diagnosis found.    MDM        Rodena Medin, PA 12/08/10 1921

## 2010-12-08 NOTE — ED Notes (Signed)
Pt states that she hit her head with not LOC and can not remember how she landed but states that she believe the laundry basket may have pushed her arm back into her body. States that she has not taken any pain medications since the accident.

## 2010-12-08 NOTE — ED Notes (Signed)
Patient given discharge instructions, information, prescriptions, and diet order. Patient states that they adequately understand discharge information given and to return to ED if symptoms return or worsen.    Patient informed to follow up with dr Yisroel Ramming and come back for any new or worsening pain, fever, or numbness associated with her injury.

## 2010-12-09 NOTE — ED Provider Notes (Addendum)
Medical screening examination/treatment/procedure(s) were performed by non-physician practitioner and as supervising physician I was immediately available for consultation/collaboration.  1. Humerus shaft fracture     I personally reviewed the patient's x-ray  Lyanne Co, MD 12/09/10 1610  Lyanne Co, MD 12/09/10 [phone]

## 2010-12-13 ENCOUNTER — Encounter (HOSPITAL_BASED_OUTPATIENT_CLINIC_OR_DEPARTMENT_OTHER): Payer: Self-pay | Admitting: *Deleted

## 2010-12-13 NOTE — Progress Notes (Signed)
To bring all meds and overnight bag All ins cards Needs ekg

## 2010-12-14 ENCOUNTER — Encounter (HOSPITAL_BASED_OUTPATIENT_CLINIC_OR_DEPARTMENT_OTHER): Payer: Self-pay | Admitting: Anesthesiology

## 2010-12-14 ENCOUNTER — Ambulatory Visit (HOSPITAL_BASED_OUTPATIENT_CLINIC_OR_DEPARTMENT_OTHER)
Admission: RE | Admit: 2010-12-14 | Discharge: 2010-12-15 | Disposition: A | Discharge status: Home / Self Care | Payer: BC Managed Care – PPO | Source: Ambulatory Visit | Attending: Orthopedic Surgery | Admitting: Orthopedic Surgery

## 2010-12-14 ENCOUNTER — Encounter (HOSPITAL_BASED_OUTPATIENT_CLINIC_OR_DEPARTMENT_OTHER): Payer: Self-pay | Admitting: *Deleted

## 2010-12-14 ENCOUNTER — Encounter (HOSPITAL_BASED_OUTPATIENT_CLINIC_OR_DEPARTMENT_OTHER)
Admission: RE | Disposition: A | Discharge status: Home / Self Care | Payer: Self-pay | Source: Ambulatory Visit | Attending: Orthopedic Surgery

## 2010-12-14 ENCOUNTER — Ambulatory Visit (HOSPITAL_BASED_OUTPATIENT_CLINIC_OR_DEPARTMENT_OTHER): Payer: BC Managed Care – PPO | Admitting: Anesthesiology

## 2010-12-14 ENCOUNTER — Ambulatory Visit (HOSPITAL_COMMUNITY): Payer: BC Managed Care – PPO

## 2010-12-14 DIAGNOSIS — F3289 Other specified depressive episodes: Secondary | ICD-10-CM | POA: Insufficient documentation

## 2010-12-14 DIAGNOSIS — Z01812 Encounter for preprocedural laboratory examination: Secondary | ICD-10-CM | POA: Insufficient documentation

## 2010-12-14 DIAGNOSIS — F329 Major depressive disorder, single episode, unspecified: Secondary | ICD-10-CM | POA: Insufficient documentation

## 2010-12-14 DIAGNOSIS — S42309A Unspecified fracture of shaft of humerus, unspecified arm, initial encounter for closed fracture: Secondary | ICD-10-CM | POA: Insufficient documentation

## 2010-12-14 DIAGNOSIS — S43429A Sprain of unspecified rotator cuff capsule, initial encounter: Secondary | ICD-10-CM | POA: Insufficient documentation

## 2010-12-14 DIAGNOSIS — W19XXXA Unspecified fall, initial encounter: Secondary | ICD-10-CM | POA: Insufficient documentation

## 2010-12-14 HISTORY — PX: ORIF HUMERUS FRACTURE: SHX2126

## 2010-12-14 SURGERY — OPEN REDUCTION INTERNAL FIXATION (ORIF) HUMERAL SHAFT FRACTURE
Anesthesia: General | Site: Arm Upper | Laterality: Left | Wound class: Clean

## 2010-12-14 MED ORDER — HYDROMORPHONE HCL PF 1 MG/ML IJ SOLN: 0.5000 mg | INTRAMUSCULAR | Status: DC | PRN

## 2010-12-14 MED ORDER — MIDAZOLAM HCL 2 MG/2ML IJ SOLN
2.0000 mg | Freq: Once | INTRAMUSCULAR | Status: AC
  Administered 2010-12-14: 2 mg via INTRAVENOUS

## 2010-12-14 MED ORDER — FENTANYL CITRATE 0.05 MG/ML IJ SOLN
100.0000 ug | Freq: Once | INTRAMUSCULAR | Status: AC
  Administered 2010-12-14: 100 ug via INTRAVENOUS

## 2010-12-14 MED ORDER — PROPOFOL 10 MG/ML IV EMUL
INTRAVENOUS | Status: DC | PRN
  Administered 2010-12-14: 200 mg via INTRAVENOUS

## 2010-12-14 MED ORDER — DEXTROSE-NACL 5-0.45 % IV SOLN
INTRAVENOUS | Status: DC
  Administered 2010-12-14: 19:00:00 via INTRAVENOUS

## 2010-12-14 MED ORDER — CEFAZOLIN SODIUM-DEXTROSE 2-3 GM-% IV SOLR
2.0000 g | Freq: Four times a day (QID) | INTRAVENOUS | Status: AC
  Administered 2010-12-14 – 2010-12-15 (×2): 2 g via INTRAVENOUS

## 2010-12-14 MED ORDER — ZOLPIDEM TARTRATE 5 MG PO TABS: 5.0000 mg | ORAL_TABLET | Freq: Every evening | ORAL | Status: DC | PRN

## 2010-12-14 MED ORDER — LACTATED RINGERS IV SOLN
INTRAVENOUS | Status: DC
  Administered 2010-12-14 (×2): via INTRAVENOUS

## 2010-12-14 MED ORDER — HYDROMORPHONE HCL PF 1 MG/ML IJ SOLN: 0.2500 mg | INTRAMUSCULAR | Status: DC | PRN

## 2010-12-14 MED ORDER — DEXAMETHASONE SODIUM PHOSPHATE 4 MG/ML IJ SOLN
INTRAMUSCULAR | Status: DC | PRN
  Administered 2010-12-14: 10 mg via INTRAVENOUS

## 2010-12-14 MED ORDER — ONDANSETRON HCL 4 MG/2ML IJ SOLN: 4.0000 mg | Freq: Four times a day (QID) | INTRAMUSCULAR | Status: DC | PRN

## 2010-12-14 MED ORDER — CISATRACURIUM BESYLATE 2 MG/ML IV SOLN
INTRAVENOUS | Status: DC | PRN
  Administered 2010-12-14: 16 mg via INTRAVENOUS

## 2010-12-14 MED ORDER — ONDANSETRON HCL 4 MG PO TABS: 4.0000 mg | ORAL_TABLET | Freq: Four times a day (QID) | ORAL | Status: DC | PRN

## 2010-12-14 MED ORDER — OXYCODONE-ACETAMINOPHEN 5-325 MG PO TABS
1.0000 | ORAL_TABLET | ORAL | Status: AC | PRN
Start: 2010-12-14 — End: 2010-12-24

## 2010-12-14 MED ORDER — FENTANYL CITRATE 0.05 MG/ML IJ SOLN
INTRAMUSCULAR | Status: DC | PRN
  Administered 2010-12-14 (×3): 25 ug via INTRAVENOUS

## 2010-12-14 MED ORDER — POVIDONE-IODINE 7.5 % EX SOLN: Freq: Once | CUTANEOUS | Status: DC

## 2010-12-14 MED ORDER — DROPERIDOL 2.5 MG/ML IJ SOLN: 0.6250 mg | INTRAMUSCULAR | Status: DC | PRN

## 2010-12-14 MED ORDER — CEFAZOLIN SODIUM-DEXTROSE 2-3 GM-% IV SOLR
2.0000 g | INTRAVENOUS | Status: AC
  Administered 2010-12-14: 2 g via INTRAVENOUS

## 2010-12-14 MED ORDER — OXYCODONE-ACETAMINOPHEN 5-325 MG PO TABS: 1.0000 | ORAL_TABLET | ORAL | Status: DC | PRN

## 2010-12-14 MED ORDER — ONDANSETRON HCL 4 MG/2ML IJ SOLN
INTRAMUSCULAR | Status: DC | PRN
  Administered 2010-12-14: 4 mg via INTRAVENOUS

## 2010-12-14 MED ORDER — EPHEDRINE SULFATE 50 MG/ML IJ SOLN
INTRAMUSCULAR | Status: DC | PRN
  Administered 2010-12-14: 10 mg via INTRAVENOUS

## 2010-12-14 SURGICAL SUPPLY — 59 items
2.0 WIRE ×2 IMPLANT
2.8 DRILL ×2 IMPLANT
BENZOIN TINCTURE PRP APPL 2/3 (GAUZE/BANDAGES/DRESSINGS) ×2 IMPLANT
BLADE SURG 15 STRL LF DISP TIS (BLADE) ×2 IMPLANT
BLADE SURG 15 STRL SS (BLADE) ×2
CANISTER SUCTION 1200CC (MISCELLANEOUS) ×2 IMPLANT
CLOTH BEACON ORANGE TIMEOUT ST (SAFETY) ×2 IMPLANT
DECANTER SPIKE VIAL GLASS SM (MISCELLANEOUS) IMPLANT
DRAPE C-ARM 42X72 X-RAY (DRAPES) ×4 IMPLANT
DRAPE INCISE IOBAN 66X45 STRL (DRAPES) IMPLANT
DRAPE OEC MINIVIEW 54X84 (DRAPES) ×4 IMPLANT
DRAPE SURG 17X23 STRL (DRAPES) ×2 IMPLANT
DRAPE U-SHAPE 47X51 STRL (DRAPES) ×2 IMPLANT
DRAPE U-SHAPE 76X120 STRL (DRAPES) ×4 IMPLANT
DRSG EMULSION OIL 3X3 NADH (GAUZE/BANDAGES/DRESSINGS) ×2 IMPLANT
DRSG PAD ABDOMINAL 8X10 ST (GAUZE/BANDAGES/DRESSINGS) ×2 IMPLANT
DURAPREP 26ML APPLICATOR (WOUND CARE) ×2 IMPLANT
ELECT REM PT RETURN 9FT ADLT (ELECTROSURGICAL) ×2
ELECTRODE REM PT RTRN 9FT ADLT (ELECTROSURGICAL) ×1 IMPLANT
GLOVE BIO SURGEON STRL SZ7 (GLOVE) ×2 IMPLANT
GLOVE BIOGEL M STRL SZ7.5 (GLOVE) ×2 IMPLANT
GLOVE BIOGEL PI IND STRL 8 (GLOVE) ×2 IMPLANT
GLOVE BIOGEL PI INDICATOR 8 (GLOVE) ×2
GLOVE ECLIPSE 7.5 STRL STRAW (GLOVE) ×4 IMPLANT
GOWN BRE IMP PREV XXLGXLNG (GOWN DISPOSABLE) ×2 IMPLANT
GOWN PREVENTION PLUS XLARGE (GOWN DISPOSABLE) ×2 IMPLANT
GOWN PREVENTION PLUS XXLARGE (GOWN DISPOSABLE) ×4 IMPLANT
NS IRRIG 1000ML POUR BTL (IV SOLUTION) ×2 IMPLANT
PACK ARTHROSCOPY DSU (CUSTOM PROCEDURE TRAY) ×2 IMPLANT
PACK BASIN DAY SURGERY FS (CUSTOM PROCEDURE TRAY) ×2 IMPLANT
PENCIL BUTTON HOLSTER BLD 10FT (ELECTRODE) ×2 IMPLANT
ROD HUMERAL 8X220MM (Rod) ×2 IMPLANT
SCREW CANCELLOUS 5.0X35 (Screw) ×2 IMPLANT
SCREW CANCELLOUS 5.0X40 (Screw) ×2 IMPLANT
SCREW CAP POLARUS (Screw) ×2 IMPLANT
SCREW CORT 3.5X25 (Screw) ×2 IMPLANT
SCREW CORTICAL 3.5X20 (Screw) ×2 IMPLANT
SCREW CORTICAL 3.5X35 (Screw) ×2 IMPLANT
SLING ARM LGE (CAST SUPPLIES) ×2 IMPLANT
SPONGE GAUZE 4X4 12PLY (GAUZE/BANDAGES/DRESSINGS) ×2 IMPLANT
SPONGE LAP 4X18 X RAY DECT (DISPOSABLE) ×2 IMPLANT
STRIP CLOSURE SKIN 1/2X4 (GAUZE/BANDAGES/DRESSINGS) ×2 IMPLANT
SUCTION FRAZIER TIP 10 FR DISP (SUCTIONS) ×2 IMPLANT
SUT ETHIBOND 2 OS 4 DA (SUTURE) ×2 IMPLANT
SUT ETHILON 3 0 FSL (SUTURE) ×4 IMPLANT
SUT FIBERWIRE #2 38 T-5 BLUE (SUTURE)
SUT MNCRL AB 3-0 PS2 18 (SUTURE) ×2 IMPLANT
SUT VIC AB 0 CT1 27 (SUTURE) ×1
SUT VIC AB 0 CT1 27XBRD ANBCTR (SUTURE) ×1 IMPLANT
SUT VIC AB 2-0 CT1 27 (SUTURE) ×2
SUT VIC AB 2-0 CT1 TAPERPNT 27 (SUTURE) ×2 IMPLANT
SUT VIC AB 2-0 SH 27 (SUTURE) ×1
SUT VIC AB 2-0 SH 27XBRD (SUTURE) ×1 IMPLANT
SUTURE FIBERWR #2 38 T-5 BLUE (SUTURE) IMPLANT
SYR BULB 3OZ (MISCELLANEOUS) ×2 IMPLANT
TAPE CLOTH SURG 6X10 WHT LF (GAUZE/BANDAGES/DRESSINGS) ×2 IMPLANT
TOWEL OR NON WOVEN STRL DISP B (DISPOSABLE) ×2 IMPLANT
WATER STERILE IRR 1000ML POUR (IV SOLUTION) ×2 IMPLANT
YANKAUER SUCT BULB TIP NO VENT (SUCTIONS) ×2 IMPLANT

## 2010-12-14 NOTE — H&P (Signed)
  Chief Complaint: Left arm pain  HPI: Hannah Cabrera is a 55 y.o. female who presents for evaluation of Left humerus pain  Secondary to fracture.. It has been present for 1 week and has been worsening. She has failed conservative measures.X-ray shows displaced left humerus fx.  Past Medical History  Diagnosis Date  . Depression    Past Surgical History  Procedure Date  . Gastric bypass   . Tonsillectomy   . Cholecystectomy 2009    lap choli  . Abdominal hysterectomy   . Dilation and curettage of uterus   . Gastric bypass 2008  . Hip arthroplasty 02/2009    fx lt hip  . Ercp 2008   History   Social History  . Marital Status: Single    Spouse Name: N/A    Number of Children: N/A  . Years of Education: N/A   Social History Main Topics  . Smoking status: Never Smoker   . Smokeless tobacco: None  . Alcohol Use: No  . Drug Use: No  . Sexually Active:    Other Topics Concern  . None   Social History Narrative  . None   History reviewed. No pertinent family history. Allergies  Allergen Reactions  . Nsaids   . Sulfa Antibiotics    Prior to Admission medications   Medication Sig Start Date End Date Taking? Authorizing Provider  buPROPion (WELLBUTRIN SR) 150 MG 12 hr tablet Take 150 mg by mouth daily.     Yes Historical Provider, MD  cholecalciferol (VITAMIN D) 1000 UNITS tablet Take 1,000 Units by mouth daily.     Yes Historical Provider, MD  escitalopram (LEXAPRO) 10 MG tablet Take 10 mg by mouth daily.     Yes Historical Provider, MD  HYDROcodone-acetaminophen (NORCO) 5-325 MG per tablet Take 1 tablet by mouth every 6 (six) hours as needed.     Yes Historical Provider, MD  Multiple Vitamins-Minerals (MULTIVITAMINS THER. W/MINERALS) TABS Take 2 tablets by mouth daily.     Yes Historical Provider, MD  vitamin B-12 (CYANOCOBALAMIN) 1000 MCG tablet Take 1,000 mcg by mouth daily.     Yes Historical Provider, MD  Calcium Citrate-Vitamin D (CITRACAL PETITES/VITAMIN D PO)  Take 2 tablets by mouth daily.      Historical Provider, MD       All other systems have been reviewed and were otherwise negative with the exception of those mentioned in the HPI and as above.  Physical Exam: Filed Vitals:   12/14/10 1212  BP: 138/81  Pulse: 66  Temp: 98.1 F (36.7 C)  Resp: 16    General: Alert, no acute distress Cardiovascular: No pedal edema Respiratory: No cyanosis, no use of accessory musculature GI: No organomegaly, abdomen is soft and non-tender Skin: No lesions in the area of chief complaint Neurologic: Sensation intact distally Psychiatric: Patient is competent for consent with normal mood and affect Lymphatic: No axillary or cervical lymphadenopathy  MUSCULOSKELETAL  Tender left humerus. N-V intact to left upper extremity. Good radial nerve function.  Assessment/Plan: left humerus fracture Plan for Procedure(s): OPEN REDUCTION INTERNAL FIXATION (ORIF) HUMERAL SHAFT FRACTURE

## 2010-12-14 NOTE — Brief Op Note (Signed)
12/14/2010  5:17 PM  PATIENT:  Hannah Cabrera  55 y.o. female  PRE-OPERATIVE DIAGNOSIS:  Left humerus fracture  POST-OPERATIVE DIAGNOSIS:  1.Left humerus fracture 2. Rotator cuff tear  PROCEDURE:  Procedure(s): 1. OPEN REDUCTION INTERNAL FIXATION (ORIF) HUMERAL SHAFT FRACTURE  2.open rotator cuff repair  SURGEON:  Surgeon(s): Harvie Junior  PHYSICIAN ASSISTANT: bethune  ASSISTANTS: bethune   ANESTHESIA:   general  EBL:  Total I/O In: 2100 [I.V.:2100] Out: -   BLOOD ADMINISTERED:none  DRAINS: none   LOCAL MEDICATIONS USED:  NONE  SPECIMEN:  No Specimen  DISPOSITION OF SPECIMEN:  N/A  COUNTS:  YES  TOURNIQUET:  * No tourniquets in log *  DICTATION: .Other Dictation: Dictation Number [id number]  PLAN OF CARE: Admit for overnight observation  PATIENT DISPOSITION:  Short Stay   Delay start of Pharmacological VTE agent (>24hrs) due to surgical blood loss or risk of bleeding:  {YES/NO/NOT APPLICABLE:20182

## 2010-12-14 NOTE — Anesthesia Preprocedure Evaluation (Signed)
Anesthesia Evaluation  Patient identified by MRN, date of birth, ID band Patient awake    Reviewed: Allergy & Precautions, H&P , NPO status , Patient's Chart, lab work & pertinent test results  History of Anesthesia Complications Negative for: history of anesthetic complications  Airway       Dental   Pulmonary neg pulmonary ROS,  clear to auscultation  Pulmonary exam normal       Cardiovascular Exercise Tolerance: Good neg cardio ROS Regular Normal- Systolic murmurs    Neuro/Psych PSYCHIATRIC DISORDERS Depression Negative Neurological ROS     GI/Hepatic negative GI ROS, Neg liver ROS,   Endo/Other  Negative Endocrine ROS  Renal/GU negative Renal ROS     Musculoskeletal   Abdominal   Peds  Hematology   Anesthesia Other Findings   Reproductive/Obstetrics                           Anesthesia Physical Anesthesia Plan  ASA: II  Anesthesia Plan: General   Post-op Pain Management:    Induction: Intravenous  Airway Management Planned: Oral ETT  Additional Equipment:   Intra-op Plan:   Post-operative Plan: Extubation in OR  Informed Consent: I have reviewed the patients History and Physical, chart, labs and discussed the procedure including the risks, benefits and alternatives for the proposed anesthesia with the patient or authorized representative who has indicated his/her understanding and acceptance.     Plan Discussed with: CRNA and Surgeon  Anesthesia Plan Comments:         Anesthesia Quick Evaluation

## 2010-12-14 NOTE — Anesthesia Postprocedure Evaluation (Signed)
Anesthesia Post Note  Patient: Hannah Cabrera  Procedure(s) Performed:  OPEN REDUCTION INTERNAL FIXATION (ORIF) HUMERAL SHAFT FRACTURE - orif left humerus with rod  Anesthesia type: General  Patient location: PACU  Post pain: Pain level controlled  Post assessment: Patient's Cardiovascular Status Stable  Last Vitals:  Filed Vitals:   12/14/10 1800  BP: 124/78  Pulse: 98  Temp:   Resp: 20    Post vital signs: Reviewed and stable  Level of consciousness: sedated  Complications: No apparent anesthesia complications

## 2010-12-14 NOTE — Anesthesia Procedure Notes (Addendum)
Performed by: Jearld Shines   Procedure Name: Intubation Date/Time: 12/14/2010 1:48 PM Performed by: Jearld Shines Pre-anesthesia Checklist: Patient identified, Timeout performed, Emergency Drugs available, Suction available and Patient being monitored Patient Re-evaluated:Patient Re-evaluated prior to inductionOxygen Delivery Method: Circle System Utilized Preoxygenation: Pre-oxygenation with 100% oxygen Intubation Type: IV induction Ventilation: Mask ventilation without difficulty Laryngoscope Size: Miller and 2 Grade View: Grade II Tube type: Oral Tube size: 7.0 mm Number of attempts: 1 Placement Confirmation: ETT inserted through vocal cords under direct vision,  positive ETCO2 and breath sounds checked- equal and bilateral Secured at: 22 cm Tube secured with: Tape Dental Injury: Teeth and Oropharynx as per pre-operative assessment  Difficulty Due To: Difficult Airway- due to anterior larynx Future Recommendations: Recommend- induction with short-acting agent, and alternative techniques readily available

## 2010-12-14 NOTE — Transfer of Care (Signed)
Immediate Anesthesia Transfer of Care Note  Patient: Hannah Cabrera  Procedure(s) Performed:  OPEN REDUCTION INTERNAL FIXATION (ORIF) HUMERAL SHAFT FRACTURE - orif left humerus with rod  Patient Location: PACU  Anesthesia Type: General and Regional  Level of Consciousness: awake and oriented  Airway & Oxygen Therapy: Patient Spontanous Breathing and Patient connected to face mask oxygen  Post-op Assessment: Report given to PACU RN and Post -op Vital signs reviewed and stable  Post vital signs: Reviewed and stable  Complications: No apparent anesthesia complications

## 2010-12-15 NOTE — Progress Notes (Signed)
Subjective: 1 Day Post-Op Procedure(s) (LRB): OPEN REDUCTION INTERNAL FIXATION (ORIF) HUMERAL SHAFT FRACTURE (Left) and rc repair Patient reports pain as 1 on 0-10 scale.    Objective: Vital signs in last 24 hours: Temp:  [98.1 F (36.7 C)-98.7 F (37.1 C)] 98.6 F (37 C) (12/01 0655) Pulse Rate:  [66-106] 79  (12/01 0655) Resp:  [15-21] 20  (12/01 0655) BP: (107-138)/(74-87) 131/79 mmHg (12/01 0655) SpO2:  [92 %-99 %] 95 % (12/01 0655)  Intake/Output from previous day: 11/30 0701 - 12/01 0700 In: 4751.3 [P.O.:2190; I.V.:2561.3] Out: 3300 [Urine:3300] Intake/Output this shift:     Basename 12/14/10 1253  HGB 11.7*   No results found for this basename: WBC:2,RBC:2,HCT:2,PLT:2 in the last 72 hours No results found for this basename: NA:2,K:2,CL:2,CO2:2,BUN:2,CREATININE:2,GLUCOSE:2,CALCIUM:2 in the last 72 hours No results found for this basename: LABPT:2,INR:2 in the last 72 hours  Neurologically intact Neurovascular intact Sensation intact distally Intact pulses distally Incision: bandage dry Compartment soft  Assessment/Plan: 1 Day Post-Op Procedure(s) (LRB): OPEN REDUCTION INTERNAL FIXATION (ORIF) HUMERAL SHAFT FRACTURE (Left) S/p rc repair  Damen Windsor L 12/15/2010, 7:19 AM

## 2010-12-15 NOTE — Op Note (Signed)
NAMEEVAN, OSBURN NO.:  [id number]  MEDICAL RECORD NO.:  [id number]  LOCATION:  ZO10                         FACILITY:  [facility]  PHYSICIAN:  Harvie Junior, M.D.   DATE OF BIRTH:  [birth year]  DATE OF PROCEDURE:  12/14/2010 DATE OF DISCHARGE:  12/08/2010                              OPERATIVE REPORT   PREOPERATIVE DIAGNOSIS:  Comminuted humerus fracture with a large lateral butterfly piece.  POSTOPERATIVE DIAGNOSES: 1. Comminuted humerus fracture with a large lateral butterfly piece. 2. Rotator cuff tear.  PROCEDURE: 1. Intramedullary rod fixation of proximal humerus fracture with     proximal and distal locking. 2. Rotator cuff repair with #2 Ethibond interrupted sutures.  SURGEON:  Harvie Junior, MD  ASSISTANT:  Marshia Ly, PA  ANESTHESIA:  General.  BRIEF HISTORY:  Ms. Tirone is a 55 year old female who fell and suffered a proximal humerus fracture.  She was in an elephant-ear posterior splint, was really struggling with her ability to take care of herself and do activities of daily living with this type of splints on, and because of complaints of difficulty with the splint, we felt that we could make her quite a bit better with a rodding procedure and we talked her about the risks and benefits.  She understood that the potential risks of nonunion, infection, failure to heal, need for further surgery, pain at the shoulder from the insertion site, bleeding, and anesthetic risk of death at the time of surgery.  Understanding the risks, she wished to proceed.  She was taken to the operating room for this procedure.  PROCEDURE: The patient was taken to the operating room.  After adequate anesthesia was obtained with general anesthetic, the patient was placed supine on the operating table.  Attention was turned to the left arm which was prepped and draped in usual sterile fashion after she was placed into the beach chair position.  All bony  prominences were well padded.  The attention was  then turned towards the left side.  After routine prep and drape, a small incision was made over anterior acromion subcutaneous tissues down the level of the deltoid.  Deltoid fibers identified in the raphe between the anterior and middle deltoid.  Fibers were divided. The biceps tendon was then identified and just a centimeter and half posterior to the biceps tendon, a longitudinal rent was made in the rotator cuff, creating a rotator cuff tear of the supraspinatus.  Once this was done, a guide was used just off the articular margin, and this was followed with placing the long guide, and this was then passed into the distal shaft.  Once this was done, attention was turned towards reaming over this and reaming down to allow access for the rod, and then the rod was fashioned on the back table, care being taken to check the distal interlocking, and once this was completed, the attention was turned towards placement of the rod.  Measured the rod intraoperatively and felt that a 220 mm rod would be appropriate.  This was chosen to be used.  A 220-mm rod was advanced down the humerus, and once this was advanced in place, the proximal locking was then  undertaken.  Prior to doing the proximal locking, we tried to do some percutaneous movement of the lateral butterfly piece, and really had a difficult time getting to move.  We made a couple of small stab incisions and really kind of got down onto the piece.  We had a good piece of bone, but there was obviously muscle tissue involved and thus we felt that the big dissection would cause too much risk to axillary nerve and probably would not help the healing process.  At this point, we locked the rod in proximally, manipulated a little bit more and once we were satisfied with rotation, locked in distally with a freehand technique.  We tried initially to use the guide straight in the distal interlock,  and so we used a freehand technique lateral to medial.  Excellent fixation was achieved here and at this point, the wound was copiously and thoroughly irrigated.  Proximally, we did 3 Ethibond interrupted sutures in the rotator cuff tear by way of rotator cuff repair, and once this was completed, attention was turned to the deltoid and was closed with 1 Vicryl running, skin with 0 and 2-0 Vicryl, and Monocryl subcuticular stitches were applied.  A sterile compressive dressing was applied as well as a posterior splint.  The patient was taken to recovery room where she was noted to be in satisfactory condition.  Estimated blood loss for the procedure was 300 mL.     Harvie Junior, M.D.     Ranae Plumber  D:  12/14/2010  T:  12/14/2010  Job:  [id number]

## 2010-12-21 ENCOUNTER — Encounter (HOSPITAL_BASED_OUTPATIENT_CLINIC_OR_DEPARTMENT_OTHER): Payer: Self-pay | Admitting: Orthopedic Surgery

## 2014-06-15 ENCOUNTER — Ambulatory Visit (INDEPENDENT_AMBULATORY_CARE_PROVIDER_SITE_OTHER): Payer: 59 | Admitting: Urgent Care

## 2014-06-15 VITALS — BP 124/86 | HR 64 | Temp 98.5°F | Resp 17 | Ht 66.5 in | Wt 219.6 lb

## 2014-06-15 DIAGNOSIS — L299 Pruritus, unspecified: Secondary | ICD-10-CM

## 2014-06-15 DIAGNOSIS — L255 Unspecified contact dermatitis due to plants, except food: Secondary | ICD-10-CM

## 2014-06-15 MED ORDER — PREDNISONE 20 MG PO TABS: 20.0000 mg | ORAL_TABLET | Freq: Every day | ORAL | Status: DC

## 2014-06-15 NOTE — Progress Notes (Signed)
    MRN: [id number] DOB: 09-Jan-1956  Subjective:   Hannah Cabrera is a 59 y.o. female presenting for chief complaint of Poison Ivy  Reports 3 day history of rash. Patient came into contact unknowingly with poison ivy on Saturday in her yard. Developed a rash over her hands and forearms bilaterally the next day. Rash is itchy and red. Today patient woke up and rash is now spread to her upper arms and neck. She has tried otc cortisone cream with minimal relief. Denies fevers, shob, wheezing, chest tightness, oral lesions, rash over legs, oozing or bleeding of rash. 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 Denies any other aggravating or relieving factors, no other questions or concerns.  Hannah Cabrera has a current medication list which includes the following prescription(s): calcium citrate-vitamin d, cholecalciferol, multivitamins ther. w/minerals, and vitamin b-12. She is allergic to nsaids and sulfa antibiotics.  Hannah Cabrera  has a past medical history of Depression. Also  has past surgical history that includes Gastric bypass; Tonsillectomy; Cholecystectomy (2009); Abdominal hysterectomy; Dilation and curettage of uterus; Gastric bypass (2008); Hip Arthroplasty (02/2009); ERCP (2008); and ORIF humerus fracture (12/14/2010).  ROS As in subjective.  Objective:   Vitals: BP 124/86 mmHg  Pulse 64  Temp(Src) 98.5 F (36.9 C) (Oral)  Resp 17  Ht 5' 6.5" (1.689 m)  Wt 219 lb 9.6 oz (99.61 kg)  BMI 34.92 kg/m2  SpO2 97%  Physical Exam  Constitutional: She appears well-developed and well-nourished.  HENT:  Mouth/Throat: Oropharynx is clear and moist.  Eyes: Conjunctivae are normal. Right eye exhibits no discharge. Left eye exhibits no discharge.  Cardiovascular: Normal rate.   Pulmonary/Chest: Effort normal.  Lymphadenopathy:    She has no cervical adenopathy.  Skin: Skin is warm and dry. Rash (as depicted, erythematous and itchy rash, no signs of impetigo) noted. There is  erythema. No pallor.       Assessment and Plan :   1. Contact dermatitis due to plant 2. Itchy skin - Due to poison ivy, will start steroid course given spread to face and hair line, advised to use Zyrtec during the day for itching, benadryl at night. - RTC if rash fails to improve or returns after steroid course completed.  Jaynee Eagles, PA-C [facility] [phone] 06/15/2014 1:16 PM

## 2014-06-15 NOTE — Patient Instructions (Addendum)
Day 1-5: Take 3 pills Prednisone (60mg  total) with breakfast. Day 6-10: Take 2 pills Prednisone (40mg  total) with breakfast. Day 11-15: Take 1 pill Prednisone (20mg ) with breakfast.  Take Prednisone in the mornings with food to prevent insomnia and upset stomach. Take Benadryl at bedtime, as it causes drowsiness. You may use an over-the-counter antihistamine (Claritin, Zyrtec, Allegra) for daytime symptoms, as well as over-the-counter calamine lotion. Stay as cool and dry as you can; taking hot showers, getting hot and sweaty or wearing tight clothing can aggravate the itching.   Poison [employer] ivy is a inflammation of the skin (contact dermatitis) caused by touching the allergens on the leaves of the ivy plant following previous exposure to the plant. The rash usually appears 48 hours after exposure. The rash is usually bumps (papules) or blisters (vesicles) in a linear pattern. Depending on your own sensitivity, the rash may simply cause redness and itching, or it may also progress to blisters which may break open. These must be well cared for to prevent secondary bacterial (germ) infection, followed by scarring. Keep any open areas dry, clean, dressed, and covered with an antibacterial ointment if needed. The eyes may also get puffy. The puffiness is worst in the morning and gets better as the day progresses. This dermatitis usually heals without scarring, within 2 to 3 weeks without treatment. HOME CARE INSTRUCTIONS  Thoroughly wash with soap and water as soon as you have been exposed to poison ivy. You have about one half hour to remove the plant resin before it will cause the rash. This washing will destroy the oil or antigen on the skin that is causing, or will cause, the rash. Be sure to wash under your fingernails as any plant resin there will continue to spread the rash. Do not rub skin vigorously when washing affected area. Poison ivy cannot spread if no oil from the plant remains on your  body. A rash that has progressed to weeping sores will not spread the rash unless you have not washed thoroughly. It is also important to wash any clothes you have been wearing as these may carry active allergens. The rash will return if you wear the unwashed clothing, even several days later. Avoidance of the plant in the future is the best measure. Poison ivy plant can be recognized by the number of leaves. Generally, poison ivy has three leaves with flowering branches on a single stem. Diphenhydramine may be purchased over the counter and used as needed for itching. Do not drive with this medication if it makes you drowsy.Ask your caregiver about medication for children. SEEK MEDICAL CARE IF:  Open sores develop.  Redness spreads beyond area of rash.  You notice purulent (pus-like) discharge.  You have increased pain.  Other signs of infection develop (such as fever). Document Released: 12/29/1999 Document Revised: 03/25/2011 Document Reviewed: 06/10/2008 [facility] Patient Information 2015 [place], [place]. This information is not intended to replace advice given to you by your health care provider. Make sure you discuss any questions you have with your health care provider.

## 2017-11-12 DIAGNOSIS — L4050 Arthropathic psoriasis, unspecified: Secondary | ICD-10-CM

## 2017-11-12 HISTORY — DX: Arthropathic psoriasis, unspecified: L40.50

## 2020-06-20 ENCOUNTER — Emergency Department (HOSPITAL_COMMUNITY)
Admission: EM | Admit: 2020-06-20 | Discharge: 2020-06-21 | Disposition: A | Discharge status: Home / Self Care | Payer: PPO | Attending: Emergency Medicine | Admitting: Emergency Medicine

## 2020-06-20 ENCOUNTER — Other Ambulatory Visit: Payer: Self-pay

## 2020-06-20 DIAGNOSIS — R42 Dizziness and giddiness: Secondary | ICD-10-CM | POA: Diagnosis not present

## 2020-06-20 DIAGNOSIS — H9202 Otalgia, left ear: Secondary | ICD-10-CM | POA: Insufficient documentation

## 2020-06-20 DIAGNOSIS — R0981 Nasal congestion: Secondary | ICD-10-CM | POA: Insufficient documentation

## 2020-06-20 DIAGNOSIS — Z5321 Procedure and treatment not carried out due to patient leaving prior to being seen by health care provider: Secondary | ICD-10-CM | POA: Diagnosis not present

## 2020-06-20 LAB — COMPREHENSIVE METABOLIC PANEL
ALT: 23 U/L (ref 0–44)
AST: 23 U/L (ref 15–41)
Albumin: 4.1 g/dL (ref 3.5–5.0)
Alkaline Phosphatase: 84 U/L (ref 38–126)
Anion gap: 7 (ref 5–15)
BUN: 17 mg/dL (ref 8–23)
CO2: 26 mmol/L (ref 22–32)
Calcium: 8.8 mg/dL — ABNORMAL LOW (ref 8.9–10.3)
Chloride: 106 mmol/L (ref 98–111)
Creatinine, Ser: 0.46 mg/dL (ref 0.44–1.00)
GFR, Estimated: >60 mL/min (ref >60)
Glucose, Bld: 99 mg/dL (ref 70–99)
Potassium: 3.5 mmol/L (ref 3.5–5.1)
Sodium: 139 mmol/L (ref 135–145)
Total Bilirubin: 0.4 mg/dL (ref 0.3–1.2)
Total Protein: 7.2 g/dL (ref 6.5–8.1)

## 2020-06-20 LAB — CBC WITH DIFFERENTIAL/PLATELET
Abs Immature Granulocytes: 0.03 10*3/uL (ref 0.00–0.07)
Basophils Absolute: 0 10*3/uL (ref 0.0–0.1)
Basophils Relative: 0 %
Eosinophils Absolute: 0.2 10*3/uL (ref 0.0–0.5)
Eosinophils Relative: 3 %
HCT: 42.9 % (ref 36.0–46.0)
Hemoglobin: 14.2 g/dL (ref 12.0–15.0)
Immature Granulocytes: 1 %
Lymphocytes Relative: 34 %
Lymphs Abs: 2.1 10*3/uL (ref 0.7–4.0)
MCH: 30.2 pg (ref 26.0–34.0)
MCHC: 33.1 g/dL (ref 30.0–36.0)
MCV: 91.3 fL (ref 80.0–100.0)
Monocytes Absolute: 0.8 10*3/uL (ref 0.1–1.0)
Monocytes Relative: 13 %
Neutro Abs: 3.1 10*3/uL (ref 1.7–7.7)
Neutrophils Relative %: 49 %
Platelets: 304 10*3/uL (ref 150–400)
RBC: 4.7 MIL/uL (ref 3.87–5.11)
RDW: 12.4 % (ref 11.5–15.5)
WBC: 6.3 10*3/uL (ref 4.0–10.5)
nRBC: 0 % (ref 0.0–0.2)

## 2020-06-20 NOTE — ED Provider Notes (Signed)
Emergency Medicine Provider Triage Evaluation Note  Hannah Cabrera 65 y.o. female was evaluated in triage.  Pt complains of dizziness that began today.  She states that it has been intermittently occurring.  She will have episodes where she feels better and an episode where it recurs again.  She states she feels like everything is spinning around her.  She has had associated nausea/vomiting.  No numbness/weakness of arms or legs.  No chest pain, difficulty breathing.   Review of Systems  Positive: Dizziness, nausea/vomiting Negative: Chest pain, difficulty breathing, numbness/weakness of arms or legs.  Physical Exam  BP 134/82   Pulse 70   Temp 98.2 F (36.8 C) (Oral)   Resp 18   Ht 5\' 4"  (1.626 m)   Wt 65.8 kg   SpO2 100%   BMI 24.89 kg/m  Gen:   Awake, no distress HEENT:  Atraumatic.  Small amount of nystagmus noted to the left. Resp:  Normal effort  Cardiac:  Normal rate  Abd:   Nondistended, nontender  MSK:   Moves extremities without difficulty  Neuro:  Speech clear   Other:   Cranial nerves III through XII intact.  5/5 strength in bilateral upper and lower extremities.  Medical Decision Making  Medically screening exam initiated at 7:30 PM  Appropriate orders placed.  Hannah Cabrera was informed that the remainder of the evaluation will be completed by another provider, this initial triage assessment does not replace that evaluation. They are counseled that they will need to remain in the ED until the completion of their workup, including full H&P and results of any tests.  Risks of leaving the emergency department prior to completion of treatment were discussed. Patient was advised to inform ED staff if they are leaving before their treatment is complete. The patient acknowledged these risks and time was allowed for questions.     The patient appears stable so that the remainder of the MSE may be completed by another provider.    Clinical Impression  Dizziness     Portions of this note were generated with Dragon dictation software. Dictation errors may occur despite best attempts at proofreading.      Volanda Napoleon, PA-C 06/20/20 1931    Milton Ferguson, MD 06/20/20 [phone]

## 2020-06-20 NOTE — ED Triage Notes (Signed)
Pt c/o intermittent dizziness starting around 6am. Pt also c/o L ear discomfort and congestion. Describes it as a room spinning feeling

## 2020-06-21 DIAGNOSIS — H8102 Meniere's disease, left ear: Secondary | ICD-10-CM | POA: Diagnosis not present

## 2020-06-21 NOTE — ED Notes (Signed)
Pt called again from triage with no answer

## 2020-06-21 NOTE — ED Notes (Signed)
Pt called x 1 no answer

## 2020-06-27 DIAGNOSIS — J343 Hypertrophy of nasal turbinates: Secondary | ICD-10-CM | POA: Diagnosis not present

## 2020-06-27 DIAGNOSIS — H6983 Other specified disorders of Eustachian tube, bilateral: Secondary | ICD-10-CM | POA: Diagnosis not present

## 2020-06-27 DIAGNOSIS — H903 Sensorineural hearing loss, bilateral: Secondary | ICD-10-CM | POA: Diagnosis not present

## 2020-06-27 DIAGNOSIS — J310 Chronic rhinitis: Secondary | ICD-10-CM | POA: Diagnosis not present

## 2020-06-27 DIAGNOSIS — R0982 Postnasal drip: Secondary | ICD-10-CM | POA: Diagnosis not present

## 2020-08-06 DIAGNOSIS — U071 COVID-19: Secondary | ICD-10-CM | POA: Diagnosis not present

## 2020-10-16 ENCOUNTER — Other Ambulatory Visit: Payer: Self-pay | Admitting: Family Medicine

## 2020-10-16 DIAGNOSIS — Z1231 Encounter for screening mammogram for malignant neoplasm of breast: Secondary | ICD-10-CM

## 2020-10-23 DIAGNOSIS — J069 Acute upper respiratory infection, unspecified: Secondary | ICD-10-CM | POA: Diagnosis not present

## 2020-10-23 DIAGNOSIS — R051 Acute cough: Secondary | ICD-10-CM | POA: Diagnosis not present

## 2020-10-23 DIAGNOSIS — Z03818 Encounter for observation for suspected exposure to other biological agents ruled out: Secondary | ICD-10-CM | POA: Diagnosis not present

## 2020-10-23 DIAGNOSIS — R509 Fever, unspecified: Secondary | ICD-10-CM | POA: Diagnosis not present

## 2020-11-13 ENCOUNTER — Ambulatory Visit
Admission: RE | Admit: 2020-11-13 | Discharge: 2020-11-13 | Disposition: A | Discharge status: Home / Self Care | Payer: PPO | Source: Ambulatory Visit | Attending: Family Medicine | Admitting: Family Medicine

## 2020-11-13 ENCOUNTER — Other Ambulatory Visit: Payer: Self-pay

## 2020-11-13 DIAGNOSIS — Z1231 Encounter for screening mammogram for malignant neoplasm of breast: Secondary | ICD-10-CM | POA: Diagnosis not present

## 2020-11-15 ENCOUNTER — Other Ambulatory Visit: Payer: Self-pay | Admitting: Family Medicine

## 2020-11-15 DIAGNOSIS — R928 Other abnormal and inconclusive findings on diagnostic imaging of breast: Secondary | ICD-10-CM

## 2021-02-07 ENCOUNTER — Ambulatory Visit
Admission: RE | Admit: 2021-02-07 | Discharge: 2021-02-07 | Disposition: A | Discharge status: Home / Self Care | Payer: PPO | Source: Ambulatory Visit | Attending: Family Medicine | Admitting: Family Medicine

## 2021-02-07 ENCOUNTER — Other Ambulatory Visit: Payer: Self-pay | Admitting: Family Medicine

## 2021-02-07 DIAGNOSIS — R928 Other abnormal and inconclusive findings on diagnostic imaging of breast: Secondary | ICD-10-CM

## 2021-02-07 DIAGNOSIS — R922 Inconclusive mammogram: Secondary | ICD-10-CM | POA: Diagnosis not present

## 2021-02-07 DIAGNOSIS — N6320 Unspecified lump in the left breast, unspecified quadrant: Secondary | ICD-10-CM

## 2021-02-12 DIAGNOSIS — D124 Benign neoplasm of descending colon: Secondary | ICD-10-CM | POA: Diagnosis not present

## 2021-02-12 DIAGNOSIS — K648 Other hemorrhoids: Secondary | ICD-10-CM | POA: Diagnosis not present

## 2021-02-12 DIAGNOSIS — Z1211 Encounter for screening for malignant neoplasm of colon: Secondary | ICD-10-CM | POA: Diagnosis not present

## 2021-02-12 DIAGNOSIS — K5730 Diverticulosis of large intestine without perforation or abscess without bleeding: Secondary | ICD-10-CM | POA: Diagnosis not present

## 2021-02-14 DIAGNOSIS — D124 Benign neoplasm of descending colon: Secondary | ICD-10-CM | POA: Diagnosis not present

## 2021-02-22 ENCOUNTER — Other Ambulatory Visit: Payer: PPO

## 2021-03-02 DIAGNOSIS — H25813 Combined forms of age-related cataract, bilateral: Secondary | ICD-10-CM | POA: Diagnosis not present

## 2021-03-02 DIAGNOSIS — H04123 Dry eye syndrome of bilateral lacrimal glands: Secondary | ICD-10-CM | POA: Diagnosis not present

## 2021-03-02 DIAGNOSIS — H53031 Strabismic amblyopia, right eye: Secondary | ICD-10-CM | POA: Diagnosis not present

## 2021-03-02 DIAGNOSIS — H11152 Pinguecula, left eye: Secondary | ICD-10-CM | POA: Diagnosis not present

## 2021-03-02 DIAGNOSIS — H524 Presbyopia: Secondary | ICD-10-CM | POA: Diagnosis not present

## 2021-03-19 DIAGNOSIS — J0100 Acute maxillary sinusitis, unspecified: Secondary | ICD-10-CM | POA: Diagnosis not present

## 2021-06-26 DIAGNOSIS — M6530 Trigger finger, unspecified finger: Secondary | ICD-10-CM | POA: Diagnosis not present

## 2021-06-26 DIAGNOSIS — M549 Dorsalgia, unspecified: Secondary | ICD-10-CM | POA: Diagnosis not present

## 2021-07-05 ENCOUNTER — Other Ambulatory Visit: Payer: Self-pay | Admitting: Family Medicine

## 2021-07-05 ENCOUNTER — Ambulatory Visit
Admission: RE | Admit: 2021-07-05 | Discharge: 2021-07-05 | Disposition: A | Discharge status: Home / Self Care | Payer: PPO | Source: Ambulatory Visit | Attending: Family Medicine | Admitting: Family Medicine

## 2021-07-05 DIAGNOSIS — D225 Melanocytic nevi of trunk: Secondary | ICD-10-CM | POA: Diagnosis not present

## 2021-07-05 DIAGNOSIS — D2221 Melanocytic nevi of right ear and external auricular canal: Secondary | ICD-10-CM | POA: Diagnosis not present

## 2021-07-05 DIAGNOSIS — D1801 Hemangioma of skin and subcutaneous tissue: Secondary | ICD-10-CM | POA: Diagnosis not present

## 2021-07-05 DIAGNOSIS — M545 Low back pain, unspecified: Secondary | ICD-10-CM

## 2021-07-05 DIAGNOSIS — L918 Other hypertrophic disorders of the skin: Secondary | ICD-10-CM | POA: Diagnosis not present

## 2021-07-05 DIAGNOSIS — D220 Melanocytic nevi of lip: Secondary | ICD-10-CM | POA: Diagnosis not present

## 2021-07-05 DIAGNOSIS — L400 Psoriasis vulgaris: Secondary | ICD-10-CM | POA: Diagnosis not present

## 2021-07-23 DIAGNOSIS — M79645 Pain in left finger(s): Secondary | ICD-10-CM | POA: Diagnosis not present

## 2021-07-23 DIAGNOSIS — M65332 Trigger finger, left middle finger: Secondary | ICD-10-CM | POA: Diagnosis not present

## 2021-08-15 ENCOUNTER — Inpatient Hospital Stay: Admission: RE | Admit: 2021-08-15 | Payer: PPO | Source: Ambulatory Visit

## 2021-08-20 DIAGNOSIS — M65332 Trigger finger, left middle finger: Secondary | ICD-10-CM | POA: Diagnosis not present

## 2021-08-24 ENCOUNTER — Ambulatory Visit
Admission: RE | Admit: 2021-08-24 | Discharge: 2021-08-24 | Disposition: A | Discharge status: Home / Self Care | Payer: PPO | Source: Ambulatory Visit | Attending: Family Medicine | Admitting: Family Medicine

## 2021-08-24 DIAGNOSIS — N6320 Unspecified lump in the left breast, unspecified quadrant: Secondary | ICD-10-CM

## 2021-08-24 DIAGNOSIS — R928 Other abnormal and inconclusive findings on diagnostic imaging of breast: Secondary | ICD-10-CM | POA: Diagnosis not present

## 2021-08-27 ENCOUNTER — Other Ambulatory Visit: Payer: Self-pay

## 2021-08-27 ENCOUNTER — Ambulatory Visit: Payer: PPO | Attending: Family Medicine

## 2021-08-27 DIAGNOSIS — M5459 Other low back pain: Secondary | ICD-10-CM | POA: Diagnosis not present

## 2021-08-27 DIAGNOSIS — R252 Cramp and spasm: Secondary | ICD-10-CM | POA: Insufficient documentation

## 2021-08-27 NOTE — Therapy (Signed)
OUTPATIENT PHYSICAL THERAPY THORACOLUMBAR EVALUATION   Patient Name: Hannah Cabrera MRN: [id number] DOB:August 17, 1955, 66 y.o., female Today's Date: 08/27/2021   PT End of Session - 08/27/21 1713     Visit Number 1    Date for PT Re-Evaluation 10/22/21    Authorization Type Medicare-[employer]    Progress Note Due on Visit 10    PT Start Time 1856    PT Stop Time 1658    PT Time Calculation (min) 41 min    Activity Tolerance Patient tolerated treatment well    Behavior During Therapy [facility] for tasks assessed/performed             Past Medical History:  Diagnosis Date   Depression    Psoriatic arthritis ([place]) 11/12/2017   Past Surgical History:  Procedure Laterality Date   ABDOMINAL HYSTERECTOMY     CHOLECYSTECTOMY  01/15/2007   lap choli   DILATION AND CURETTAGE OF UTERUS     ERCP  01/14/2006   GASTRIC BYPASS     GASTRIC BYPASS  01/14/2006   HIP ARTHROPLASTY  02/14/2009   fx lt hip   ORIF HUMERUS FRACTURE  12/14/2010   Procedure: OPEN REDUCTION INTERNAL FIXATION (ORIF) HUMERAL SHAFT FRACTURE;  Surgeon: Alta Corning;  Location: [place];  Service: Orthopedics;  Laterality: Left;  orif left humerus with rod   REDUCTION MAMMAPLASTY     TONSILLECTOMY     There are no problems to display for this patient.   PCP: Jonathon Jordan, MD  REFERRING PROVIDER: Jonathon Jordan, MD  REFERRING DIAG: Chronic low back pain  Rationale for Evaluation and Treatment Rehabilitation  THERAPY DIAG:  Other low back pain - Plan: PT plan of care cert/re-cert  Cramp and spasm - Plan: PT plan of care cert/re-cert  ONSET DATE: 31-49 month history   SUBJECTIVE:                                                                                                                                                                                           SUBJECTIVE STATEMENT: Pt reports to PT with LBP that began 10-12 months ago due to lifting and carrying her  grandchild. Pt reports that pain is now intermittent and less painful that it was when it began.    PERTINENT HISTORY:  Depression, gastric bypass, Lt hip replacement 2011  PAIN:  Are you having pain? Yes: NPRS scale: 0/10 today, 3 weeks ago up to 7/10  Pain location: Rt low back and gluteals  Pain description: aching  Aggravating factors: carrying grandson, lifting grandson Relieving factors: Tylenol, Thermacare, topical rub   PRECAUTIONS: None  WEIGHT BEARING RESTRICTIONS No  FALLS:  Has patient fallen in last 6 months? No  LIVING ENVIRONMENT: Lives with: lives with their family  OCCUPATION: Real estate attorney   PLOF: Independent  PATIENT GOALS reduce LBP, improve strength    OBJECTIVE:   DIAGNOSTIC FINDINGS:  Lumbar DDD per pt report   PATIENT SURVEYS:  FOTO 71 (goal is 7)  SCREENING FOR RED FLAGS: Bowel or bladder incontinence: No Spinal tumors: No Cauda equina syndrome: No Compression fracture: No Abdominal aneurysm: No  COGNITION:  Overall cognitive status: Within functional limits for tasks assessed     SENSATION: WFL  MUSCLE LENGTH: Hamstring length limited by 25% bilaterally  POSTURE: rounded shoulders, forward head, and flexed trunk   PALPATION: Reduced PA mobility in thoracic and lumbar spine with pain L2-4.  Tension and pain Rt lumbar paraspinals and trigger points in proximal gluteals.   LUMBAR ROM:   Full lumbar A/ROM with Rt lumbar pain reported with flexion and return from flexion to neutral.    LOWER EXTREMITY ROM:     Rt hip ER and IR limited by 25% vs the Lt with Rt gluteal pain.    LOWER EXTREMITY MMT:    4+/5 bil LE strength   GAIT: Distance walked: 50 Assistive device utilized: None Level of assistance: Complete Independence Comments: No gait deviations     TODAY'S TREATMENT  Date: 08/27/21 HEP established-see below    PATIENT EDUCATION:  Education details: Access Code: [email or web address], Economist  Person  educated: Patient Education method: Explanation, Demonstration, and Handouts Education comprehension: verbalized understanding and returned demonstration   HOME EXERCISE PROGRAM: Access Code: [email or web address] URL: [email or web address] Date: 08/27/2021 Prepared by: Claiborne Billings  Exercises - Supine Lower Trunk Rotation  - 3 x daily - 7 x weekly - 1 sets - 3 reps - 20 hold - Hooklying Single Knee to Chest  - 3 x daily - 7 x weekly - 1 sets - 3 reps - 20 hold - Seated Hamstring Stretch  - 3 x daily - 7 x weekly - 1 sets - 3 reps - 20 hold - Seated Transversus Abdominis Bracing  - 1 x daily - 7 x weekly - 3 sets - 10 reps - Seated Piriformis Stretch with Trunk Bend  - 3 x daily - 7 x weekly - 1 sets - 3 reps - 20 hold  Patient Education - Lifting Techniques  ASSESSMENT:  CLINICAL IMPRESSION: Patient is a 66 y.o. female who was seen today for physical therapy evaluation and treatment for LBP that began after lifting and carrying her grandchild.  Pt reports that pain overall has been better in the past 3 weeks. Pt reports that pain as 7/10 3 weeks ago and has now been 0/10.  Pt with painful lumbar A/ROM on the Rt side and demonstrates reduced hip flexibility Rt>Lt.  Pt with reduced PA mobility in the thoracic and lumbar spine with pain L2-4 and tension in Rt lumbar paraspinals and gluteals.  Patient will benefit from skilled PT to address the below impairments and improve overall function.   OBJECTIVE IMPAIRMENTS decreased activity tolerance, decreased endurance, decreased strength, increased muscle spasms, impaired flexibility, improper body mechanics, postural dysfunction, and pain.   ACTIVITY LIMITATIONS carrying, lifting, bending, standing, and caring for others  PARTICIPATION LIMITATIONS: cleaning, laundry, and community activity  PERSONAL FACTORS Age and 1 comorbidity: lumbar DDD  are also affecting patient's functional outcome.   REHAB POTENTIAL: Good  CLINICAL DECISION MAKING:  Stable/uncomplicated  EVALUATION COMPLEXITY: Low   GOALS:  Goals reviewed with patient? Yes  SHORT TERM GOALS: Target date: 09/24/2021  Be independent in initial HEP Baseline: Goal status: INITIAL  2.  Verbalize and demonstrate body mechanics modifications for lumbar protection with ADLs and care of grandchild Baseline:  Goal status: INITIAL    LONG TERM GOALS: Target date: 10/22/2021  Be independent in advanced HEP Baseline:  Goal status: INITIAL  2.  Report < or = to 1-2/10 max LBP with care of grandchild Baseline:  Goal status: INITIAL  3.  Verbalize correct mechanics and core activation with lifting and carrying grandchild to reduce recurrence of LBP Baseline:  Goal status: INITIAL   PLAN: PT FREQUENCY: 2x/week  PT DURATION: 8 weeks  PLANNED INTERVENTIONS: Therapeutic exercises, Therapeutic activity, Neuromuscular re-education, Balance training, Gait training, Patient/Family education, Self Care, Joint mobilization, Aquatic Therapy, Dry Needling, Cryotherapy, Moist heat, Taping, Vasopneumatic device, Traction, Manual therapy, and Re-evaluation.  PLAN FOR NEXT SESSION: body mechanics education-demo and have pt practice, review HEP, advance HEP, manual to Rt lumbar and gluteals (DN if pt agrees)   Sigurd Sos, PT 08/27/21 5:14 PM   [facility] [address], [place], [place] [address] Phone # [phone] Fax [phone]

## 2021-08-27 NOTE — Patient Instructions (Signed)

## 2021-09-05 ENCOUNTER — Ambulatory Visit: Payer: PPO | Admitting: Physical Therapy

## 2021-09-10 ENCOUNTER — Ambulatory Visit: Payer: PPO

## 2021-09-10 DIAGNOSIS — M5459 Other low back pain: Secondary | ICD-10-CM | POA: Diagnosis not present

## 2021-09-10 DIAGNOSIS — R252 Cramp and spasm: Secondary | ICD-10-CM

## 2021-09-10 NOTE — Therapy (Signed)
OUTPATIENT PHYSICAL THERAPY TREATMENT   Patient Name: Hannah Cabrera MRN: [id number] DOB:02-Jun-1955, 66 y.o., female Today's Date: 09/10/2021   PT End of Session - 09/10/21 1440     Visit Number 2    Date for PT Re-Evaluation 10/22/21    Authorization Type Medicare-[employer]    Progress Note Due on Visit 10    PT Start Time 1401    PT Stop Time 1441    PT Time Calculation (min) 40 min    Activity Tolerance Patient tolerated treatment well    Behavior During Therapy [facility] for tasks assessed/performed              Past Medical History:  Diagnosis Date   Depression    Psoriatic arthritis ([place]) 11/12/2017   Past Surgical History:  Procedure Laterality Date   ABDOMINAL HYSTERECTOMY     CHOLECYSTECTOMY  01/15/2007   lap choli   DILATION AND CURETTAGE OF UTERUS     ERCP  01/14/2006   GASTRIC BYPASS     GASTRIC BYPASS  01/14/2006   HIP ARTHROPLASTY  02/14/2009   fx lt hip   ORIF HUMERUS FRACTURE  12/14/2010   Procedure: OPEN REDUCTION INTERNAL FIXATION (ORIF) HUMERAL SHAFT FRACTURE;  Surgeon: Alta Corning;  Location: [place];  Service: Orthopedics;  Laterality: Left;  orif left humerus with rod   REDUCTION MAMMAPLASTY     TONSILLECTOMY     There are no problems to display for this patient.   PCP: Jonathon Jordan, MD  REFERRING PROVIDER: Jonathon Jordan, MD  REFERRING DIAG: Chronic low back pain  Rationale for Evaluation and Treatment Rehabilitation  THERAPY DIAG:  Other low back pain  Cramp and spasm  ONSET DATE: 10-12 month history   SUBJECTIVE:                                                                                                                                                                                           SUBJECTIVE STATEMENT: I started doing my exercises more frequently and then not as much as I was over the past few days. I had one bad day since the last time I was here.    PERTINENT HISTORY:   Depression, gastric bypass, Lt hip replacement 2011  PAIN:  Are you having pain? Yes: NPRS scale: 0/10 today, up to 7/10 Pain location: Rt low back and gluteals  Pain description: aching  Aggravating factors: carrying grandson, lifting grandson Relieving factors: Tylenol, Thermacare, topical rub   PRECAUTIONS: None  WEIGHT BEARING RESTRICTIONS No  FALLS:  Has patient fallen in last 6 months? No  LIVING ENVIRONMENT: Lives with:  lives with their family  OCCUPATION: Real estate attorney   PLOF: Independent  PATIENT GOALS reduce LBP, improve strength    OBJECTIVE:   DIAGNOSTIC FINDINGS:  Lumbar DDD per pt report   PATIENT SURVEYS:  FOTO 71 (goal is 41)  SCREENING FOR RED FLAGS: Bowel or bladder incontinence: No Spinal tumors: No Cauda equina syndrome: No Compression fracture: No Abdominal aneurysm: No  COGNITION:  Overall cognitive status: Within functional limits for tasks assessed     SENSATION: WFL  MUSCLE LENGTH: Hamstring length limited by 25% bilaterally  POSTURE: rounded shoulders, forward head, and flexed trunk   PALPATION: Reduced PA mobility in thoracic and lumbar spine with pain L2-4.  Tension and pain Rt lumbar paraspinals and trigger points in proximal gluteals.   LUMBAR ROM:   Full lumbar A/ROM with Rt lumbar pain reported with flexion and return from flexion to neutral.    LOWER EXTREMITY ROM:     Rt hip ER and IR limited by 25% vs the Lt with Rt gluteal pain.    LOWER EXTREMITY MMT:    4+/5 bil LE strength   GAIT: Distance walked: 50 Assistive device utilized: None Level of assistance: Complete Independence Comments: No gait deviations     TODAY'S TREATMENT  Date: 09/10/21 Seated hamstring and piriformis stretch 3x20 seconds  Knee to chest and trunk rotation 3x20 seconds  Trigger Point Dry-Needling  Treatment instructions: Expect mild to moderate muscle soreness. S/S of pneumothorax if dry needled over a lung field, and to  seek immediate medical attention should they occur. Patient verbalized understanding of these instructions and education.  Patient Consent Given: Yes Education handout provided: Yes Muscles treated: lumbar multifidi and Rt>Lt gluteals  Treatment response/outcome: Utilized skilled palpation to identify trigger points.  During dry needling able to palpate muscle twitch and muscle elongation  Elongation to bil lumbar and gluteals  Skilled palpation and monitoring by PT during dry needling  Date: 08/27/21 HEP established-see below    PATIENT EDUCATION:  Education details: Access Code: [email or web address], Economist  Person educated: Patient Education method: Explanation, Demonstration, and Handouts Education comprehension: verbalized understanding and returned demonstration   HOME EXERCISE PROGRAM: Access Code: [email or web address] URL: [email or web address] Date: 08/27/2021 Prepared by: Claiborne Billings  Exercises - Supine Lower Trunk Rotation  - 3 x daily - 7 x weekly - 1 sets - 3 reps - 20 hold - Hooklying Single Knee to Chest  - 3 x daily - 7 x weekly - 1 sets - 3 reps - 20 hold - Seated Hamstring Stretch  - 3 x daily - 7 x weekly - 1 sets - 3 reps - 20 hold - Seated Transversus Abdominis Bracing  - 1 x daily - 7 x weekly - 3 sets - 10 reps - Seated Piriformis Stretch with Trunk Bend  - 3 x daily - 7 x weekly - 1 sets - 3 reps - 20 hold  Patient Education - Lifting Techniques  ASSESSMENT:  CLINICAL IMPRESSION: Pt reports that she hasn't had much pain since last session.  Pt has intermittent episodes of lumbar pain with lifting and moving.  Pt is independent and compliant with HEP although not as consistent as she would like to be. Pt with tension and trigger points in the lumbar spine and had good twitch response and improved tissue mobility after manual and DN today.  Patient will benefit from skilled PT to address the below impairments and improve overall function.   OBJECTIVE IMPAIRMENTS  decreased activity tolerance, decreased endurance, decreased  strength, increased muscle spasms, impaired flexibility, improper body mechanics, postural dysfunction, and pain.   ACTIVITY LIMITATIONS carrying, lifting, bending, standing, and caring for others  PARTICIPATION LIMITATIONS: cleaning, laundry, and community activity  PERSONAL FACTORS Age and 1 comorbidity: lumbar DDD  are also affecting patient's functional outcome.   REHAB POTENTIAL: Good  CLINICAL DECISION MAKING: Stable/uncomplicated  EVALUATION COMPLEXITY: Low   GOALS: Goals reviewed with patient? Yes  SHORT TERM GOALS: Target date: 09/24/2021  Be independent in initial HEP Baseline: Goal status: in progress   2.  Verbalize and demonstrate body mechanics modifications for lumbar protection with ADLs and care of grandchild Baseline:  Goal status: MET    LONG TERM GOALS: Target date: 10/22/2021  Be independent in advanced HEP Baseline:  Goal status: INITIAL  2.  Report < or = to 1-2/10 max LBP with care of grandchild Baseline:  Goal status: INITIAL  3.  Verbalize correct mechanics and core activation with lifting and carrying grandchild to reduce recurrence of LBP Baseline:  Goal status: INITIAL   PLAN: PT FREQUENCY: 2x/week  PT DURATION: 8 weeks  PLANNED INTERVENTIONS: Therapeutic exercises, Therapeutic activity, Neuromuscular re-education, Balance training, Gait training, Patient/Family education, Self Care, Joint mobilization, Aquatic Therapy, Dry Needling, Cryotherapy, Moist heat, Taping, Vasopneumatic device, Traction, Manual therapy, and Re-evaluation.  PLAN FOR NEXT SESSION: assess response to DN, add core strength to HEP   Sigurd Sos, PT 09/10/21 2:41 PM   [facility] [address] [place], [place] [address] Phone # [phone] Fax [phone]

## 2021-09-19 ENCOUNTER — Ambulatory Visit: Payer: PPO

## 2021-09-22 ENCOUNTER — Emergency Department (HOSPITAL_COMMUNITY): Payer: PPO

## 2021-09-22 ENCOUNTER — Encounter (HOSPITAL_COMMUNITY): Payer: Self-pay

## 2021-09-22 ENCOUNTER — Emergency Department (HOSPITAL_COMMUNITY)
Admission: EM | Admit: 2021-09-22 | Discharge: 2021-09-22 | Disposition: A | Discharge status: Home / Self Care | Payer: PPO | Attending: Emergency Medicine | Admitting: Emergency Medicine

## 2021-09-22 DIAGNOSIS — S40022A Contusion of left upper arm, initial encounter: Secondary | ICD-10-CM

## 2021-09-22 DIAGNOSIS — Z043 Encounter for examination and observation following other accident: Secondary | ICD-10-CM | POA: Diagnosis not present

## 2021-09-22 DIAGNOSIS — W109XXA Fall (on) (from) unspecified stairs and steps, initial encounter: Secondary | ICD-10-CM | POA: Diagnosis not present

## 2021-09-22 DIAGNOSIS — S59912A Unspecified injury of left forearm, initial encounter: Secondary | ICD-10-CM | POA: Diagnosis present

## 2021-09-22 NOTE — ED Triage Notes (Signed)
"  Missed last step and fell on to left arm. I feel like it is broken between my elbow and wrist" per pt.  No obvious deformity noted.

## 2021-09-22 NOTE — Discharge Instructions (Addendum)
Overall suspect you have a bone bruise.  Recommend ice, Tylenol, ibuprofen.  Recommend sling for comfort.  Follow-up with your primary care doctor if pain is persistent.

## 2021-09-22 NOTE — ED Provider Notes (Signed)
[facility] DEPT Provider Note   CSN: [id number] Arrival date & time: 09/22/21  1529     History  Chief Complaint  Patient presents with   Arm Injury    Hannah Cabrera is a 66 y.o. female.  Patient here with left forearm pain after fall.  Nothing makes it worse or better.  History of injury to the left humerus in the past.  Not having any discomfort above the elbow on the left.  Pain mostly to the left mid forearm.  Denies any weakness, numbness.  Did not hit her head or lose consciousness.  Not on blood thinners.  The history is provided by the patient.       Home Medications Prior to Admission medications   Medication Sig Start Date End Date Taking? Authorizing Provider  acetaminophen (TYLENOL) 500 MG tablet Take 500 mg by mouth every 6 (six) hours as needed.    [provider]  buPROPion (WELLBUTRIN SR) 150 MG 12 hr tablet Take 150 mg by mouth 2 (two) times daily.    [provider]  Calcium Citrate-Vitamin D (CITRACAL PETITES/VITAMIN D PO) Take 2 tablets by mouth daily.      [provider]  cholecalciferol (VITAMIN D) 1000 UNITS tablet Take 1,000 Units by mouth daily.      [provider]  meclizine (ANTIVERT) 25 MG tablet Take 25 mg by mouth 3 (three) times daily as needed for dizziness.    [provider]  Multiple Vitamins-Minerals (MULTIVITAMINS THER. W/MINERALS) TABS Take 2 tablets by mouth daily.      [provider]  vitamin B-12 (CYANOCOBALAMIN) 1000 MCG tablet Take 1,000 mcg by mouth daily.      [provider]      Allergies    Nsaids and Sulfa antibiotics    Review of Systems   Review of Systems  Physical Exam Updated Vital Signs BP (!) 174/93 (BP Location: Right Arm)   Pulse 70   Temp 98.2 F (36.8 C) (Oral)   Resp 18   Ht '5\' 6"'$  (1.676 m)   Wt 95.3 kg   SpO2 99%   BMI 33.89 kg/m  Physical Exam Vitals and nursing note reviewed.  Constitutional:       General: She is not in acute distress.    Appearance: She is well-developed.  HENT:     Head: Normocephalic and atraumatic.  Eyes:     Conjunctiva/sclera: Conjunctivae normal.  Cardiovascular:     Rate and Rhythm: Normal rate and regular rhythm.     Pulses: Normal pulses.     Heart sounds: No murmur heard. Pulmonary:     Effort: Pulmonary effort is normal. No respiratory distress.     Breath sounds: Normal breath sounds.  Abdominal:     Palpations: Abdomen is soft.     Tenderness: There is no abdominal tenderness.  Musculoskeletal:        General: Tenderness present. No swelling.     Cervical back: Neck supple.     Comments: Tenderness to the mid left forearm, normal range of motion without discomfort at the left elbow and left wrist, no obvious deformity on exam, no midline spinal tenderness  Skin:    General: Skin is warm and dry.     Capillary Refill: Capillary refill takes less than 2 seconds.  Neurological:     General: No focal deficit present.     Mental Status: She is alert and oriented to person, place, and time.  Cranial Nerves: No cranial nerve deficit.     Sensory: No sensory deficit.     Motor: No weakness.     Coordination: Coordination normal.  Psychiatric:        Mood and Affect: Mood normal.     ED Results / Procedures / Treatments   Labs (all labs ordered are listed, but only abnormal results are displayed) Labs Reviewed - No data to display  EKG None  Radiology DG Forearm Left  Result Date: 09/22/2021 CLINICAL DATA:  Fall. EXAM: LEFT FOREARM - 2 VIEW COMPARISON:  None Available. FINDINGS: There is no evidence of fracture or other focal bone lesions. Soft tissues are unremarkable. Humeral intramedullary nail is partially visualized. IMPRESSION: No acute bony abnormality. Electronically Signed   By: Ronney Asters M.D.   On: 09/22/2021 16:21    Procedures Procedures    Medications Ordered in ED Medications - No data to display  ED Course/  Medical Decision Making/ A&P                           Medical Decision Making Amount and/or Complexity of Data Reviewed Radiology: ordered.   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  Pain to the left mid forearm.  No obvious deformity.  Neurovascular neuromuscular intact on exam.  X-ray per my review and interpretation shows no fracture or malalignment.  Urology report with the same.  Suspect contusion.  Will place in a sling and have her follow-up with her primary care doctor.  Discharged in good condition.  Understands return precautions.  No other pain elsewhere.  No headache or neck pain.   This chart was dictated using voice recognition software.  Despite best efforts to proofread,  errors can occur which can change the documentation meaning.         Final Clinical Impression(s) / ED Diagnoses Final diagnoses:  Contusion of left upper extremity, initial encounter    Rx / DC Orders ED Discharge Orders     None         Lennice Sites, DO 09/22/21 1710

## 2021-09-24 ENCOUNTER — Ambulatory Visit: Payer: PPO | Attending: Family Medicine

## 2021-09-24 DIAGNOSIS — R252 Cramp and spasm: Secondary | ICD-10-CM | POA: Diagnosis not present

## 2021-09-24 DIAGNOSIS — M5459 Other low back pain: Secondary | ICD-10-CM

## 2021-09-24 NOTE — Therapy (Addendum)
OUTPATIENT PHYSICAL THERAPY TREATMENT   Patient Name: Hannah Cabrera MRN: [id number] DOB:[birth year], 66 y.o., female Today's Date: 09/24/2021   PT End of Session - 09/24/21 1638     Visit Number 3    Date for PT Re-Evaluation 10/22/21    Authorization Type Medicare-[employer]    Progress Note Due on Visit 10    PT Start Time 6812    PT Stop Time 7517    PT Time Calculation (min) 16 min    Activity Tolerance Patient tolerated treatment well    Behavior During Therapy [facility] for tasks assessed/performed               Past Medical History:  Diagnosis Date   Depression    Psoriatic arthritis ([place]) 11/12/2017   Past Surgical History:  Procedure Laterality Date   ABDOMINAL HYSTERECTOMY     CHOLECYSTECTOMY  01/15/2007   lap choli   DILATION AND CURETTAGE OF UTERUS     ERCP  01/14/2006   GASTRIC BYPASS     GASTRIC BYPASS  01/14/2006   HIP ARTHROPLASTY  02/14/2009   fx lt hip   ORIF HUMERUS FRACTURE  12/14/2010   Procedure: OPEN REDUCTION INTERNAL FIXATION (ORIF) HUMERAL SHAFT FRACTURE;  Surgeon: Alta Corning;  Location: [facility];  Service: Orthopedics;  Laterality: Left;  orif left humerus with rod   REDUCTION MAMMAPLASTY     TONSILLECTOMY     There are no problems to display for this patient.   PCP: Jonathon Jordan, MD  REFERRING PROVIDER: Jonathon Jordan, MD  REFERRING DIAG: Chronic low back pain  Rationale for Evaluation and Treatment Rehabilitation  THERAPY DIAG:  Other low back pain  Cramp and spasm  ONSET DATE: 10-12 month history   SUBJECTIVE:                                                                                                                                                                                           SUBJECTIVE STATEMENT: I had a fall and hurt my shoulder on 09/22/21.  I was helping my grandson off the stairs.  Prior to the fall, my back was doing well.  I did a lot of yardwork and it was sore.     PERTINENT HISTORY:  Depression, gastric bypass, Lt hip replacement 2011  PAIN:  Are you having pain? Yes: NPRS scale: up to 6/10 after gardening.  2/10 with daily tasks.   Pain location: Rt low back and gluteals  Pain description: aching  Aggravating factors: carrying grandson, lifting grandson Relieving factors: Tylenol, Thermacare, topical rub   PRECAUTIONS: None  WEIGHT BEARING RESTRICTIONS No  FALLS:  Has patient fallen in last 6 months? No  LIVING ENVIRONMENT: Lives with: lives with their family  OCCUPATION: Real estate attorney   PLOF: Independent  PATIENT GOALS reduce LBP, improve strength    OBJECTIVE:   DIAGNOSTIC FINDINGS:  Lumbar DDD per pt report   PATIENT SURVEYS:  FOTO 71 (goal is 71) 09/24/21: 65  SCREENING FOR RED FLAGS: Bowel or bladder incontinence: No Spinal tumors: No Cauda equina syndrome: No Compression fracture: No Abdominal aneurysm: No  COGNITION:  Overall cognitive status: Within functional limits for tasks assessed     SENSATION: WFL  MUSCLE LENGTH: Hamstring length limited by 25% bilaterally  POSTURE: rounded shoulders, forward head, and flexed trunk   PALPATION: Reduced PA mobility in thoracic and lumbar spine with pain L2-4.  Tension and pain Rt lumbar paraspinals and trigger points in proximal gluteals.   LUMBAR ROM:  Full lumbar A/ROM with Rt lumbar pain reported with flexion and return from flexion to neutral.    LOWER EXTREMITY ROM:     Rt hip ER and IR limited by 25% vs the Lt with Rt gluteal pain.    LOWER EXTREMITY MMT:    4+/5 bil LE strength   GAIT: Distance walked: 50 Assistive device utilized: None Level of assistance: Complete Independence Comments: No gait deviations     TODAY'S TREATMENT  09/24/21:  Pt with arm in sling due to fall 2 days ago and declined DN today.  Discussed how to do exercises for low back with modifications for Lt UE in sling.  Pt would like to be placed on hold at this  time.   Education regarding neutral alignment and reduced compensatory motion  Date: 09/10/21 Seated hamstring and piriformis stretch 3x20 seconds  Knee to chest and trunk rotation 3x20 seconds  Trigger Point Dry-Needling  Treatment instructions: Expect mild to moderate muscle soreness. S/S of pneumothorax if dry needled over a lung field, and to seek immediate medical attention should they occur. Patient verbalized understanding of these instructions and education.  Patient Consent Given: Yes Education handout provided: Yes Muscles treated: lumbar multifidi and Rt>Lt gluteals  Treatment response/outcome: Utilized skilled palpation to identify trigger points.  During dry needling able to palpate muscle twitch and muscle elongation  Elongation to bil lumbar and gluteals  Skilled palpation and monitoring by PT during dry needling  Date: 08/27/21 HEP established-see below    PATIENT EDUCATION:  Education details: Access Code: [email or web address], Economist  Person educated: Patient Education method: Explanation, Demonstration, and Handouts Education comprehension: verbalized understanding and returned demonstration   HOME EXERCISE PROGRAM: Access Code: [email or web address] URL: [email or web address] Date: 08/27/2021 Prepared by: Claiborne Billings  Exercises - Supine Lower Trunk Rotation  - 3 x daily - 7 x weekly - 1 sets - 3 reps - 20 hold - Hooklying Single Knee to Chest  - 3 x daily - 7 x weekly - 1 sets - 3 reps - 20 hold - Seated Hamstring Stretch  - 3 x daily - 7 x weekly - 1 sets - 3 reps - 20 hold - Seated Transversus Abdominis Bracing  - 1 x daily - 7 x weekly - 3 sets - 10 reps - Seated Piriformis Stretch with Trunk Bend  - 3 x daily - 7 x weekly - 1 sets - 3 reps - 20 hold  Patient Education - Lifting Techniques  ASSESSMENT:  CLINICAL IMPRESSION: Pt arrived with Lt arm in sling due to fall 2 days ago.  Pt declined manual therapy or advancement  of exercises at this time.  Review of  all lumbar HEP and discussed importance of symmetry and alignment while in the sling.  No change in FOTO score.  Pt reports that she had increased pain with gardening due to length of time spent doing this.  Pain has resolved  Pt requested to be placed on hold.   Patient will benefit from skilled PT to address the below impairments and improve overall function.   OBJECTIVE IMPAIRMENTS decreased activity tolerance, decreased endurance, decreased strength, increased muscle spasms, impaired flexibility, improper body mechanics, postural dysfunction, and pain.   ACTIVITY LIMITATIONS carrying, lifting, bending, standing, and caring for others  PARTICIPATION LIMITATIONS: cleaning, laundry, and community activity  PERSONAL FACTORS Age and 1 comorbidity: lumbar DDD  are also affecting patient's functional outcome.   REHAB POTENTIAL: Good  CLINICAL DECISION MAKING: Stable/uncomplicated  EVALUATION COMPLEXITY: Low   GOALS: Goals reviewed with patient? Yes  SHORT TERM GOALS: Target date: 09/24/2021  Be independent in initial HEP Baseline: Goal status: MET  2.  Verbalize and demonstrate body mechanics modifications for lumbar protection with ADLs and care of grandchild Baseline:  Goal status: MET    LONG TERM GOALS: Target date: 10/22/2021  Be independent in advanced HEP Baseline:  Goal status: INITIAL  2.  Report < or = to 1-2/10 max LBP with care of grandchild Baseline:  Goal status: INITIAL  3.  Verbalize correct mechanics and core activation with lifting and carrying grandchild to reduce recurrence of LBP Baseline:  Goal status: INITIAL   PLAN: PT FREQUENCY: 2x/week  PT DURATION: 8 weeks  PLANNED INTERVENTIONS: Therapeutic exercises, Therapeutic activity, Neuromuscular re-education, Balance training, Gait training, Patient/Family education, Self Care, Joint mobilization, Aquatic Therapy, Dry Needling, Cryotherapy, Moist heat, Taping, Vasopneumatic device, Traction, Manual  therapy, and Re-evaluation.  PLAN FOR NEXT SESSION:  Hold chart and resume PT if pt returns.    Sigurd Sos, PT 09/24/21 4:38 PM  PHYSICAL THERAPY DISCHARGE SUMMARY  Visits from Start of Care: 3  Current functional level related to goals / functional outcomes: See above for current status.     Remaining deficits: See above.  Pt requested to be placed on hold.     Education / Equipment: HEP   Patient agrees to discharge. Patient goals were not met. Patient is being discharged due to the patient's request.  Sigurd Sos, PT 10/31/21 7:05 AM   [facility] [address], [place], [place] [address] Phone # [phone] Fax [phone]

## 2021-10-09 DIAGNOSIS — M25522 Pain in left elbow: Secondary | ICD-10-CM | POA: Diagnosis not present

## 2021-10-11 DIAGNOSIS — M79602 Pain in left arm: Secondary | ICD-10-CM | POA: Diagnosis not present

## 2021-11-30 ENCOUNTER — Emergency Department (HOSPITAL_BASED_OUTPATIENT_CLINIC_OR_DEPARTMENT_OTHER)
Admission: EM | Admit: 2021-11-30 | Discharge: 2021-12-01 | Disposition: A | Discharge status: Home / Self Care | Payer: PPO | Attending: Emergency Medicine | Admitting: Emergency Medicine

## 2021-11-30 ENCOUNTER — Emergency Department (HOSPITAL_BASED_OUTPATIENT_CLINIC_OR_DEPARTMENT_OTHER): Payer: PPO | Admitting: Radiology

## 2021-11-30 ENCOUNTER — Encounter (HOSPITAL_BASED_OUTPATIENT_CLINIC_OR_DEPARTMENT_OTHER): Payer: Self-pay

## 2021-11-30 DIAGNOSIS — S42351A Displaced comminuted fracture of shaft of humerus, right arm, initial encounter for closed fracture: Secondary | ICD-10-CM | POA: Diagnosis not present

## 2021-11-30 DIAGNOSIS — S42291A Other displaced fracture of upper end of right humerus, initial encounter for closed fracture: Secondary | ICD-10-CM | POA: Diagnosis not present

## 2021-11-30 DIAGNOSIS — S42201A Unspecified fracture of upper end of right humerus, initial encounter for closed fracture: Secondary | ICD-10-CM | POA: Diagnosis not present

## 2021-11-30 DIAGNOSIS — W08XXXA Fall from other furniture, initial encounter: Secondary | ICD-10-CM | POA: Insufficient documentation

## 2021-11-30 DIAGNOSIS — S59911A Unspecified injury of right forearm, initial encounter: Secondary | ICD-10-CM | POA: Diagnosis present

## 2021-11-30 MED ORDER — OXYCODONE-ACETAMINOPHEN 5-325 MG PO TABS: 1.0000 | ORAL_TABLET | ORAL | 0 refills | Status: AC | PRN

## 2021-11-30 MED ORDER — OXYCODONE-ACETAMINOPHEN 5-325 MG PO TABS
2.0000 | ORAL_TABLET | Freq: Once | ORAL | Status: AC
  Administered 2021-12-01: 2 via ORAL
  Filled 2021-11-30: qty 2

## 2021-11-30 NOTE — ED Triage Notes (Signed)
Pt. Presents from home, ambulatory, aaxo4, states she was at a pottery class when she lost her balance on a stool and fell onto her R arm. States she did not lost consciousness or hit her head, denies thinners. CC of R arm distal to the shoulder and above the elbow

## 2021-11-30 NOTE — ED Provider Notes (Signed)
[facility] Provider Note: [name], MD, FACEP  CSN: [id number] MRN: [id number] ARRIVAL: 11/30/21 at 2026 ROOM: [place]  Arm Injury (R)   HISTORY OF PRESENT ILLNESS  11/30/21 11:36 PM Hannah Cabrera is a 66 y.o. female who was at a mosaic class earlier this evening sitting on a large stool.  Her foot "fell asleep" and when she stepped down off the stool this caused her to fall onto her right shoulder.  She is now having pain in her right shoulder which she rates as a 6 out of 10, aching in nature.  It is worse with attempted movement of the right shoulder.  She has no loss of motor or sensory function distal to the right shoulder.  She denies other injury.  She took some extra strength Tylenol around 7 PM without adequate relief of the pain.   Past Medical History:  Diagnosis Date   Depression    Psoriatic arthritis ([place]) 11/12/2017    Past Surgical History:  Procedure Laterality Date   ABDOMINAL HYSTERECTOMY     CHOLECYSTECTOMY  01/15/2007   lap choli   DILATION AND CURETTAGE OF UTERUS     ERCP  01/14/2006   GASTRIC BYPASS     GASTRIC BYPASS  01/14/2006   HIP ARTHROPLASTY  02/14/2009   fx lt hip   ORIF HUMERUS FRACTURE  12/14/2010   Procedure: OPEN REDUCTION INTERNAL FIXATION (ORIF) HUMERAL SHAFT FRACTURE;  Surgeon: Alta Corning;  Location: [facility];  Service: Orthopedics;  Laterality: Left;  orif left humerus with rod   REDUCTION MAMMAPLASTY     TONSILLECTOMY      History reviewed. No pertinent family history.  Social History   Tobacco Use   Smoking status: Never  Substance Use Topics   Alcohol use: No   Drug use: No    Prior to Admission medications   Medication Sig Start Date End Date Taking? Authorizing Provider  oxyCODONE-acetaminophen (PERCOCET/ROXICET) 5-325 MG tablet Take 1 tablet by mouth every 4 (four) hours as needed for severe pain. 11/30/21  Yes Jordie Skalsky, MD  buPROPion [facility] SR) 150  MG 12 hr tablet Take 150 mg by mouth 2 (two) times daily.    [provider]  Calcium Citrate-Vitamin D (CITRACAL PETITES/VITAMIN D PO) Take 2 tablets by mouth daily.      [provider]  cholecalciferol (VITAMIN D) 1000 UNITS tablet Take 1,000 Units by mouth daily.      [provider]  meclizine (ANTIVERT) 25 MG tablet Take 25 mg by mouth 3 (three) times daily as needed for dizziness.    [provider]  Multiple Vitamins-Minerals (MULTIVITAMINS THER. W/MINERALS) TABS Take 2 tablets by mouth daily.      [provider]  vitamin B-12 (CYANOCOBALAMIN) 1000 MCG tablet Take 1,000 mcg by mouth daily.      [provider]    Allergies Nsaids and Sulfa antibiotics   REVIEW OF SYSTEMS  Negative except as noted here or in the History of Present Illness.   PHYSICAL EXAMINATION  Initial Vital Signs Blood pressure (!) 137/111, pulse 71, temperature 98.4 F (36.9 C), temperature source Oral, resp. rate 18, height '5\' 6"'$  (1.676 m), weight 93 kg, SpO2 98 %.  Examination General: Well-developed, well-nourished female in no acute distress; appearance consistent with age of record HENT: normocephalic; atraumatic Eyes: Normal appearance Neck: supple Heart: regular rate and rhythm Lungs: clear to auscultation bilaterally Abdomen: soft; nondistended; nontender; bowel sounds  present Extremities: Tenderness and decreased range of motion of right shoulder, right upper extremity distally neurovascularly intact Neurologic: Awake, alert and oriented; motor function intact in all extremities and symmetric; no facial droop Skin: Warm and dry Psychiatric: Normal mood and affect   RESULTS  Summary of this visit's results, reviewed and interpreted by myself:   EKG Interpretation  Date/Time:    Ventricular Rate:    PR Interval:    QRS Duration:   QT Interval:    QTC Calculation:   R Axis:     Text Interpretation:         Laboratory  Studies: No results found for this or any previous visit (from the past 24 hour(s)). Imaging Studies: DG Shoulder Right  Result Date: 11/30/2021 CLINICAL DATA:  Lost balance on stool at pottery class leading to fall into right arm. EXAM: RIGHT SHOULDER - 2+ VIEW COMPARISON:  None Available. FINDINGS: Comminuted proximal humerus fracture with dominant fracture plane involving the surgical neck. Displacement involving the lateral humeral head of 7 mm. Glenohumeral joint remains congruent. No additional fracture. Mild degenerative change of the acromioclavicular joint. Included right ribs are intact. IMPRESSION: Comminuted mildly displaced proximal humerus fracture with dominant fracture plane involving the surgical neck. Electronically Signed   By: Keith Rake M.D.   On: 11/30/2021 21:53    ED COURSE and MDM  Nursing notes, initial and subsequent vitals signs, including pulse oximetry, reviewed and interpreted by myself.  Vitals:   11/30/21 2043 11/30/21 2044 11/30/21 2315 11/30/21 2330  BP: (!) 172/120  129/71 (!) 137/111  Pulse: 68  71 71  Resp: 18   18  Temp: 98.4 F (36.9 C)     TempSrc: Oral     SpO2: 96%  98% 98%  Weight:  93 kg    Height:  '5\' 6"'$  (1.676 m)     Medications  oxyCODONE-acetaminophen (PERCOCET/ROXICET) 5-325 MG per tablet 2 tablet (has no administration in time range)   We will place the patient's right arm in a shoulder immobilizer.  She is a patient of [facility], with whom she will follow up.  No evidence of neurovascular damage on exam.  PROCEDURES  Procedures   ED DIAGNOSES     ICD-10-CM   1. Fall from stool  W08.XXXA     2. Other closed displaced fracture of proximal end of right humerus, initial encounter  Y59.093J          Shanon Rosser, MD 11/30/21 2357

## 2021-12-01 DIAGNOSIS — S42291A Other displaced fracture of upper end of right humerus, initial encounter for closed fracture: Secondary | ICD-10-CM | POA: Diagnosis not present

## 2021-12-01 NOTE — ED Notes (Signed)
Late entry -- Entered room per pt request to be repositioned - 2nd nurse [place], RN to bedside to assist with repositioning patient.  Pt guarding RUE secondary to R humeral injury -- RUE distal neurovascular status remains intact.   Due to clothing unable to assess skin - no deformity noted through clothing.  Pt denies any immediate needs questions concerns as she awaits provider- call bell in reach.  Will continue to monitor and maintain plan of care.

## 2021-12-03 DIAGNOSIS — S42291A Other displaced fracture of upper end of right humerus, initial encounter for closed fracture: Secondary | ICD-10-CM | POA: Diagnosis not present

## 2021-12-13 DIAGNOSIS — M25511 Pain in right shoulder: Secondary | ICD-10-CM | POA: Diagnosis not present

## 2021-12-17 ENCOUNTER — Telehealth: Payer: Self-pay

## 2021-12-17 NOTE — Telephone Encounter (Signed)
     Patient  visit on 12/01/2021  at [facility] was for Fall from other furniture, initial encounter.  Have you been able to follow up with your primary care physician? Patient saw an Ortho physician.  The patient was or was not able to obtain any needed medicine or equipment. Yes. Patient stated that she was unable to fill the prescription because her pharmacy did not have it and they were unable to send it to another pharmacy. She left a message a [facility] for the charge nurse to call her back but did not receive a return call. She did eventually get her medication.  Are there diet recommendations that you are having difficulty following? No  Patient expresses understanding of discharge instructions and education provided has no other needs at this time.    [place] Resource Care Guide   [email or web address]  ?? [phone]   Website: [email or web address]  [email or web address]

## 2021-12-27 DIAGNOSIS — M25511 Pain in right shoulder: Secondary | ICD-10-CM | POA: Diagnosis not present

## 2021-12-31 DIAGNOSIS — S42251D Displaced fracture of greater tuberosity of right humerus, subsequent encounter for fracture with routine healing: Secondary | ICD-10-CM | POA: Diagnosis not present

## 2022-01-18 DIAGNOSIS — S42251D Displaced fracture of greater tuberosity of right humerus, subsequent encounter for fracture with routine healing: Secondary | ICD-10-CM | POA: Diagnosis not present

## 2022-01-22 DIAGNOSIS — S42251D Displaced fracture of greater tuberosity of right humerus, subsequent encounter for fracture with routine healing: Secondary | ICD-10-CM | POA: Diagnosis not present

## 2022-01-22 DIAGNOSIS — M25511 Pain in right shoulder: Secondary | ICD-10-CM | POA: Diagnosis not present

## 2022-01-24 DIAGNOSIS — S42251D Displaced fracture of greater tuberosity of right humerus, subsequent encounter for fracture with routine healing: Secondary | ICD-10-CM | POA: Diagnosis not present

## 2022-01-28 DIAGNOSIS — S42251D Displaced fracture of greater tuberosity of right humerus, subsequent encounter for fracture with routine healing: Secondary | ICD-10-CM | POA: Diagnosis not present

## 2022-01-31 DIAGNOSIS — S42251D Displaced fracture of greater tuberosity of right humerus, subsequent encounter for fracture with routine healing: Secondary | ICD-10-CM | POA: Diagnosis not present

## 2022-02-05 DIAGNOSIS — M79671 Pain in right foot: Secondary | ICD-10-CM | POA: Diagnosis not present

## 2022-02-05 DIAGNOSIS — E669 Obesity, unspecified: Secondary | ICD-10-CM | POA: Diagnosis not present

## 2022-02-05 DIAGNOSIS — M79672 Pain in left foot: Secondary | ICD-10-CM | POA: Diagnosis not present

## 2022-02-05 DIAGNOSIS — Z780 Asymptomatic menopausal state: Secondary | ICD-10-CM | POA: Diagnosis not present

## 2022-02-05 DIAGNOSIS — Z6833 Body mass index (BMI) 33.0-33.9, adult: Secondary | ICD-10-CM | POA: Diagnosis not present

## 2022-02-05 DIAGNOSIS — L4050 Arthropathic psoriasis, unspecified: Secondary | ICD-10-CM | POA: Diagnosis not present

## 2022-02-05 DIAGNOSIS — M25532 Pain in left wrist: Secondary | ICD-10-CM | POA: Diagnosis not present

## 2022-02-05 DIAGNOSIS — M2560 Stiffness of unspecified joint, not elsewhere classified: Secondary | ICD-10-CM | POA: Diagnosis not present

## 2022-02-05 DIAGNOSIS — M2540 Effusion, unspecified joint: Secondary | ICD-10-CM | POA: Diagnosis not present

## 2022-02-06 DIAGNOSIS — S42251D Displaced fracture of greater tuberosity of right humerus, subsequent encounter for fracture with routine healing: Secondary | ICD-10-CM | POA: Diagnosis not present

## 2022-02-07 ENCOUNTER — Other Ambulatory Visit: Payer: Self-pay | Admitting: Rheumatology

## 2022-02-07 DIAGNOSIS — Z780 Asymptomatic menopausal state: Secondary | ICD-10-CM

## 2022-02-15 DIAGNOSIS — S42251D Displaced fracture of greater tuberosity of right humerus, subsequent encounter for fracture with routine healing: Secondary | ICD-10-CM | POA: Diagnosis not present

## 2022-02-19 DIAGNOSIS — S42291D Other displaced fracture of upper end of right humerus, subsequent encounter for fracture with routine healing: Secondary | ICD-10-CM | POA: Diagnosis not present

## 2022-02-19 DIAGNOSIS — S42251D Displaced fracture of greater tuberosity of right humerus, subsequent encounter for fracture with routine healing: Secondary | ICD-10-CM | POA: Diagnosis not present

## 2022-02-25 DIAGNOSIS — S42251D Displaced fracture of greater tuberosity of right humerus, subsequent encounter for fracture with routine healing: Secondary | ICD-10-CM | POA: Diagnosis not present

## 2022-02-26 DIAGNOSIS — M4690 Unspecified inflammatory spondylopathy, site unspecified: Secondary | ICD-10-CM | POA: Diagnosis not present

## 2022-02-26 DIAGNOSIS — M2540 Effusion, unspecified joint: Secondary | ICD-10-CM | POA: Diagnosis not present

## 2022-02-26 DIAGNOSIS — E669 Obesity, unspecified: Secondary | ICD-10-CM | POA: Diagnosis not present

## 2022-02-26 DIAGNOSIS — L4050 Arthropathic psoriasis, unspecified: Secondary | ICD-10-CM | POA: Diagnosis not present

## 2022-02-26 DIAGNOSIS — Z6833 Body mass index (BMI) 33.0-33.9, adult: Secondary | ICD-10-CM | POA: Diagnosis not present

## 2022-02-26 DIAGNOSIS — M25532 Pain in left wrist: Secondary | ICD-10-CM | POA: Diagnosis not present

## 2022-02-26 DIAGNOSIS — M2560 Stiffness of unspecified joint, not elsewhere classified: Secondary | ICD-10-CM | POA: Diagnosis not present

## 2022-02-26 DIAGNOSIS — Z79899 Other long term (current) drug therapy: Secondary | ICD-10-CM | POA: Diagnosis not present

## 2022-02-27 DIAGNOSIS — S42251D Displaced fracture of greater tuberosity of right humerus, subsequent encounter for fracture with routine healing: Secondary | ICD-10-CM | POA: Diagnosis not present

## 2022-03-04 DIAGNOSIS — S42251D Displaced fracture of greater tuberosity of right humerus, subsequent encounter for fracture with routine healing: Secondary | ICD-10-CM | POA: Diagnosis not present

## 2022-03-07 DIAGNOSIS — S42251D Displaced fracture of greater tuberosity of right humerus, subsequent encounter for fracture with routine healing: Secondary | ICD-10-CM | POA: Diagnosis not present

## 2022-03-11 DIAGNOSIS — S42251D Displaced fracture of greater tuberosity of right humerus, subsequent encounter for fracture with routine healing: Secondary | ICD-10-CM | POA: Diagnosis not present

## 2022-03-13 DIAGNOSIS — S42251D Displaced fracture of greater tuberosity of right humerus, subsequent encounter for fracture with routine healing: Secondary | ICD-10-CM | POA: Diagnosis not present

## 2022-03-14 DIAGNOSIS — F330 Major depressive disorder, recurrent, mild: Secondary | ICD-10-CM | POA: Diagnosis not present

## 2022-03-19 DIAGNOSIS — M67911 Unspecified disorder of synovium and tendon, right shoulder: Secondary | ICD-10-CM | POA: Diagnosis not present

## 2022-04-04 DIAGNOSIS — M79671 Pain in right foot: Secondary | ICD-10-CM | POA: Diagnosis not present

## 2022-04-04 DIAGNOSIS — M79672 Pain in left foot: Secondary | ICD-10-CM | POA: Diagnosis not present

## 2022-04-04 DIAGNOSIS — L4050 Arthropathic psoriasis, unspecified: Secondary | ICD-10-CM | POA: Diagnosis not present

## 2022-04-12 ENCOUNTER — Ambulatory Visit
Admission: RE | Admit: 2022-04-12 | Discharge: 2022-04-12 | Disposition: A | Discharge status: Home / Self Care | Payer: PPO | Source: Ambulatory Visit | Attending: Rheumatology | Admitting: Rheumatology

## 2022-04-12 DIAGNOSIS — Z780 Asymptomatic menopausal state: Secondary | ICD-10-CM | POA: Diagnosis not present

## 2022-04-12 DIAGNOSIS — M810 Age-related osteoporosis without current pathological fracture: Secondary | ICD-10-CM | POA: Diagnosis not present

## 2022-04-12 DIAGNOSIS — M8588 Other specified disorders of bone density and structure, other site: Secondary | ICD-10-CM | POA: Diagnosis not present

## 2022-04-16 DIAGNOSIS — S42201A Unspecified fracture of upper end of right humerus, initial encounter for closed fracture: Secondary | ICD-10-CM | POA: Diagnosis not present

## 2022-05-02 DIAGNOSIS — M79671 Pain in right foot: Secondary | ICD-10-CM | POA: Diagnosis not present

## 2022-05-02 DIAGNOSIS — L4050 Arthropathic psoriasis, unspecified: Secondary | ICD-10-CM | POA: Diagnosis not present

## 2022-05-02 DIAGNOSIS — M79672 Pain in left foot: Secondary | ICD-10-CM | POA: Diagnosis not present

## 2022-05-28 DIAGNOSIS — E669 Obesity, unspecified: Secondary | ICD-10-CM | POA: Diagnosis not present

## 2022-05-28 DIAGNOSIS — Z6830 Body mass index (BMI) 30.0-30.9, adult: Secondary | ICD-10-CM | POA: Diagnosis not present

## 2022-05-28 DIAGNOSIS — M2560 Stiffness of unspecified joint, not elsewhere classified: Secondary | ICD-10-CM | POA: Diagnosis not present

## 2022-05-28 DIAGNOSIS — L4050 Arthropathic psoriasis, unspecified: Secondary | ICD-10-CM | POA: Diagnosis not present

## 2022-05-28 DIAGNOSIS — R300 Dysuria: Secondary | ICD-10-CM | POA: Diagnosis not present

## 2022-05-28 DIAGNOSIS — M2540 Effusion, unspecified joint: Secondary | ICD-10-CM | POA: Diagnosis not present

## 2022-05-28 DIAGNOSIS — Z79899 Other long term (current) drug therapy: Secondary | ICD-10-CM | POA: Diagnosis not present

## 2022-05-28 DIAGNOSIS — M4690 Unspecified inflammatory spondylopathy, site unspecified: Secondary | ICD-10-CM | POA: Diagnosis not present

## 2022-05-28 DIAGNOSIS — M25532 Pain in left wrist: Secondary | ICD-10-CM | POA: Diagnosis not present

## 2022-06-25 DIAGNOSIS — Z79899 Other long term (current) drug therapy: Secondary | ICD-10-CM | POA: Diagnosis not present

## 2022-06-25 DIAGNOSIS — L4050 Arthropathic psoriasis, unspecified: Secondary | ICD-10-CM | POA: Diagnosis not present

## 2022-06-25 DIAGNOSIS — M79672 Pain in left foot: Secondary | ICD-10-CM | POA: Diagnosis not present

## 2022-06-25 DIAGNOSIS — M79671 Pain in right foot: Secondary | ICD-10-CM | POA: Diagnosis not present

## 2022-08-15 DIAGNOSIS — S52501A Unspecified fracture of the lower end of right radius, initial encounter for closed fracture: Secondary | ICD-10-CM | POA: Diagnosis not present

## 2022-08-15 DIAGNOSIS — R600 Localized edema: Secondary | ICD-10-CM | POA: Diagnosis not present

## 2022-08-15 DIAGNOSIS — S0990XA Unspecified injury of head, initial encounter: Secondary | ICD-10-CM | POA: Diagnosis not present

## 2022-08-15 DIAGNOSIS — W1839XA Other fall on same level, initial encounter: Secondary | ICD-10-CM | POA: Diagnosis not present

## 2022-08-27 DIAGNOSIS — S52514A Nondisplaced fracture of right radial styloid process, initial encounter for closed fracture: Secondary | ICD-10-CM | POA: Diagnosis not present

## 2022-08-28 DIAGNOSIS — M810 Age-related osteoporosis without current pathological fracture: Secondary | ICD-10-CM | POA: Diagnosis not present

## 2022-08-28 DIAGNOSIS — Z6830 Body mass index (BMI) 30.0-30.9, adult: Secondary | ICD-10-CM | POA: Diagnosis not present

## 2022-08-28 DIAGNOSIS — M25532 Pain in left wrist: Secondary | ICD-10-CM | POA: Diagnosis not present

## 2022-08-28 DIAGNOSIS — M4690 Unspecified inflammatory spondylopathy, site unspecified: Secondary | ICD-10-CM | POA: Diagnosis not present

## 2022-08-28 DIAGNOSIS — E669 Obesity, unspecified: Secondary | ICD-10-CM | POA: Diagnosis not present

## 2022-08-28 DIAGNOSIS — Z79899 Other long term (current) drug therapy: Secondary | ICD-10-CM | POA: Diagnosis not present

## 2022-08-28 DIAGNOSIS — M2540 Effusion, unspecified joint: Secondary | ICD-10-CM | POA: Diagnosis not present

## 2022-08-28 DIAGNOSIS — L4050 Arthropathic psoriasis, unspecified: Secondary | ICD-10-CM | POA: Diagnosis not present

## 2022-08-28 DIAGNOSIS — M2560 Stiffness of unspecified joint, not elsewhere classified: Secondary | ICD-10-CM | POA: Diagnosis not present

## 2022-09-10 DIAGNOSIS — M25531 Pain in right wrist: Secondary | ICD-10-CM | POA: Diagnosis not present

## 2022-10-08 DIAGNOSIS — M25531 Pain in right wrist: Secondary | ICD-10-CM | POA: Diagnosis not present

## 2022-12-03 DIAGNOSIS — M2540 Effusion, unspecified joint: Secondary | ICD-10-CM | POA: Diagnosis not present

## 2022-12-03 DIAGNOSIS — M2560 Stiffness of unspecified joint, not elsewhere classified: Secondary | ICD-10-CM | POA: Diagnosis not present

## 2022-12-03 DIAGNOSIS — E669 Obesity, unspecified: Secondary | ICD-10-CM | POA: Diagnosis not present

## 2022-12-03 DIAGNOSIS — M25532 Pain in left wrist: Secondary | ICD-10-CM | POA: Diagnosis not present

## 2022-12-03 DIAGNOSIS — Z79899 Other long term (current) drug therapy: Secondary | ICD-10-CM | POA: Diagnosis not present

## 2022-12-03 DIAGNOSIS — M4690 Unspecified inflammatory spondylopathy, site unspecified: Secondary | ICD-10-CM | POA: Diagnosis not present

## 2022-12-03 DIAGNOSIS — Z6830 Body mass index (BMI) 30.0-30.9, adult: Secondary | ICD-10-CM | POA: Diagnosis not present

## 2022-12-03 DIAGNOSIS — L4050 Arthropathic psoriasis, unspecified: Secondary | ICD-10-CM | POA: Diagnosis not present

## 2022-12-03 DIAGNOSIS — M810 Age-related osteoporosis without current pathological fracture: Secondary | ICD-10-CM | POA: Diagnosis not present

## 2022-12-16 DIAGNOSIS — F329 Major depressive disorder, single episode, unspecified: Secondary | ICD-10-CM | POA: Diagnosis not present

## 2022-12-16 DIAGNOSIS — H8110 Benign paroxysmal vertigo, unspecified ear: Secondary | ICD-10-CM | POA: Diagnosis not present

## 2022-12-16 DIAGNOSIS — Z23 Encounter for immunization: Secondary | ICD-10-CM | POA: Diagnosis not present

## 2022-12-16 DIAGNOSIS — H6980 Other specified disorders of Eustachian tube, unspecified ear: Secondary | ICD-10-CM | POA: Diagnosis not present

## 2022-12-16 DIAGNOSIS — L4050 Arthropathic psoriasis, unspecified: Secondary | ICD-10-CM | POA: Diagnosis not present

## 2022-12-16 DIAGNOSIS — D84821 Immunodeficiency due to drugs: Secondary | ICD-10-CM | POA: Diagnosis not present

## 2023-03-04 DIAGNOSIS — M2560 Stiffness of unspecified joint, not elsewhere classified: Secondary | ICD-10-CM | POA: Diagnosis not present

## 2023-03-04 DIAGNOSIS — Z79899 Other long term (current) drug therapy: Secondary | ICD-10-CM | POA: Diagnosis not present

## 2023-03-04 DIAGNOSIS — L4050 Arthropathic psoriasis, unspecified: Secondary | ICD-10-CM | POA: Diagnosis not present

## 2023-03-04 DIAGNOSIS — M2540 Effusion, unspecified joint: Secondary | ICD-10-CM | POA: Diagnosis not present

## 2023-03-13 DIAGNOSIS — M810 Age-related osteoporosis without current pathological fracture: Secondary | ICD-10-CM | POA: Diagnosis not present

## 2023-04-03 DIAGNOSIS — K080 Exfoliation of teeth due to systemic causes: Secondary | ICD-10-CM | POA: Diagnosis not present

## 2023-04-10 DIAGNOSIS — K080 Exfoliation of teeth due to systemic causes: Secondary | ICD-10-CM | POA: Diagnosis not present

## 2023-08-26 IMAGING — CR DG LUMBAR SPINE COMPLETE 4+V
5 series · 5 of 5 positions shown · non-contrast
Comparison: None Available.

CLINICAL DATA: Back pain.

EXAM:
LUMBAR SPINE - COMPLETE 4+ VIEW

[t l-spine a.p.]
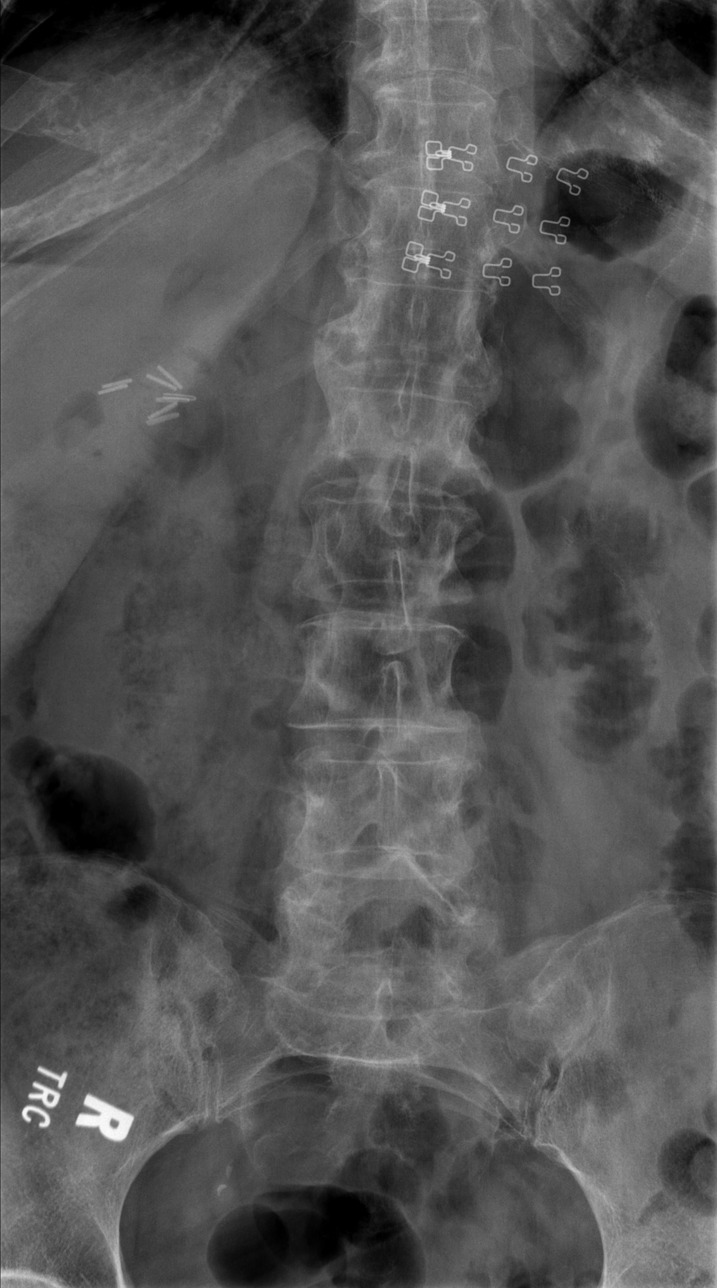

[t l-spine oblique exposure (1 of 2)]
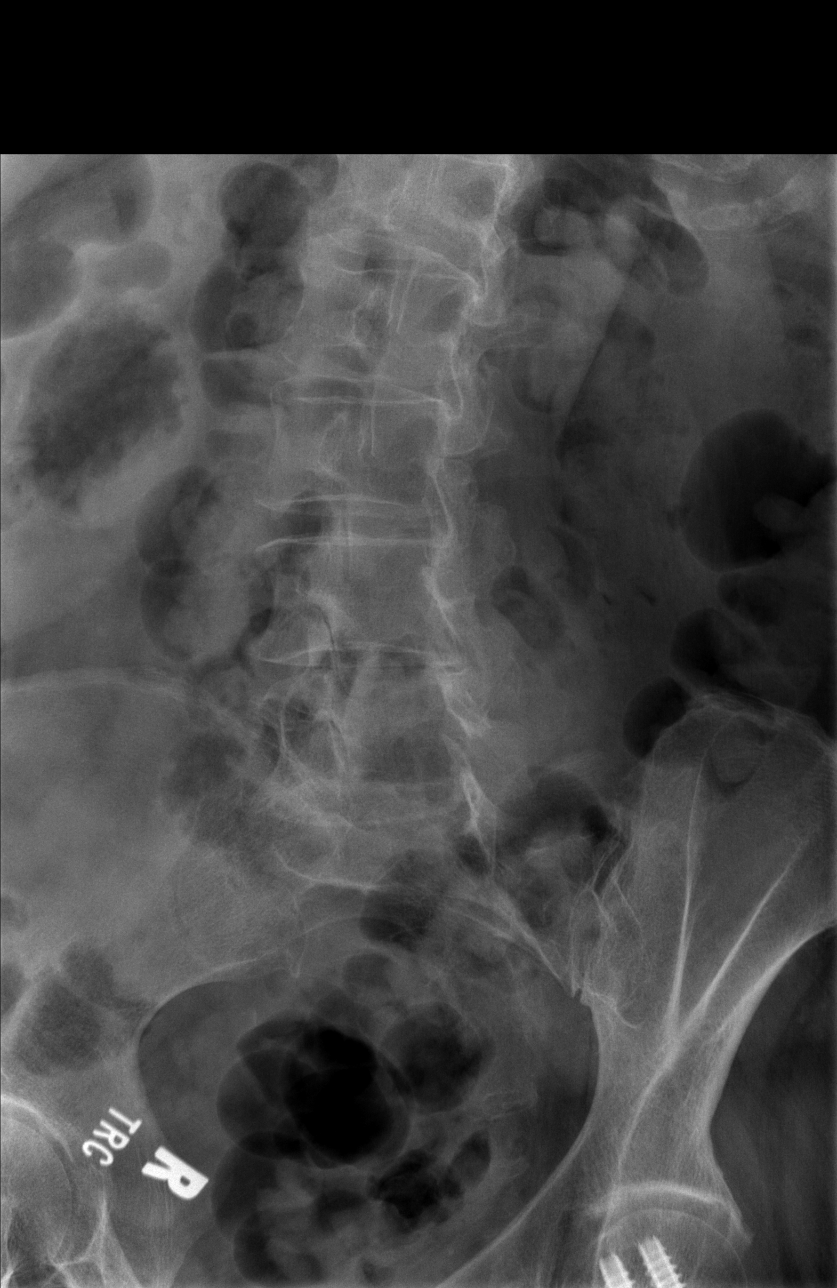

[t l-spine oblique exposure (2 of 2)]
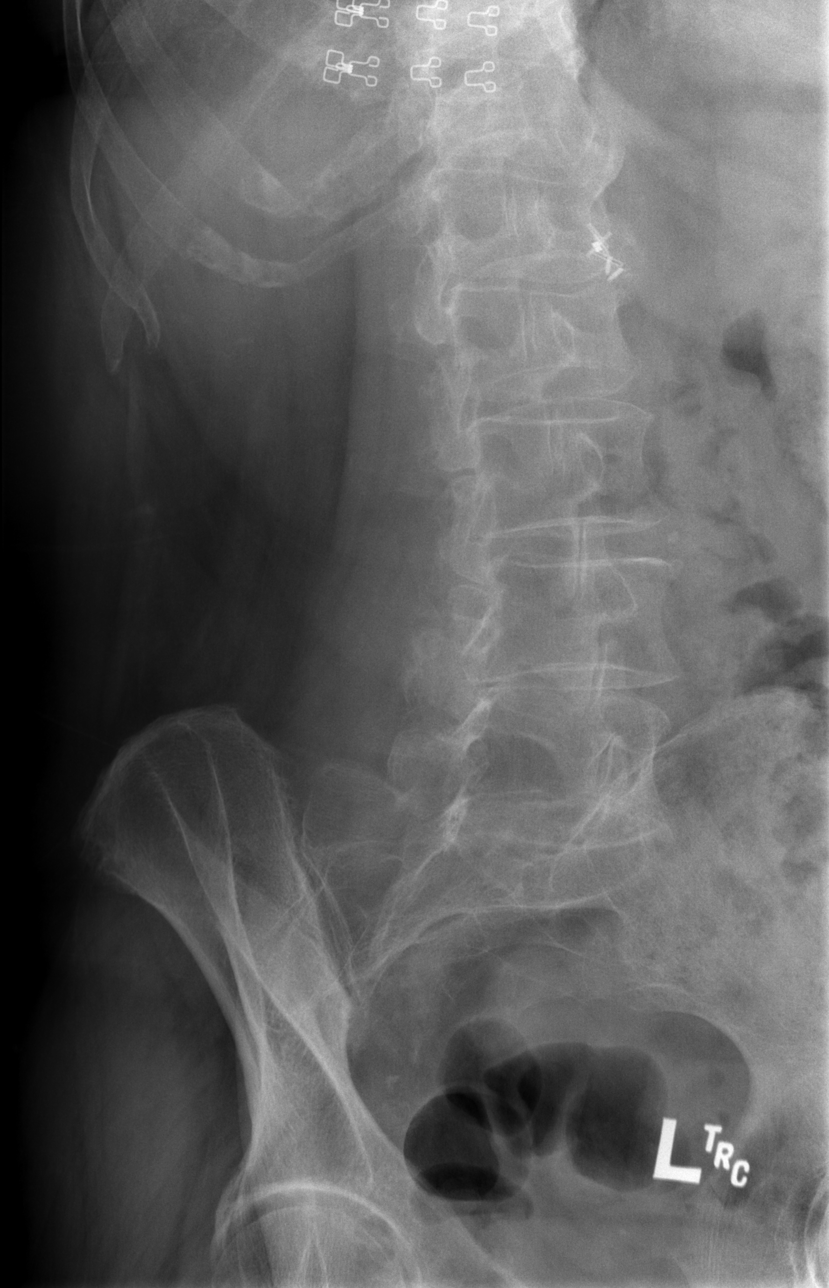

[t l-spine lat]
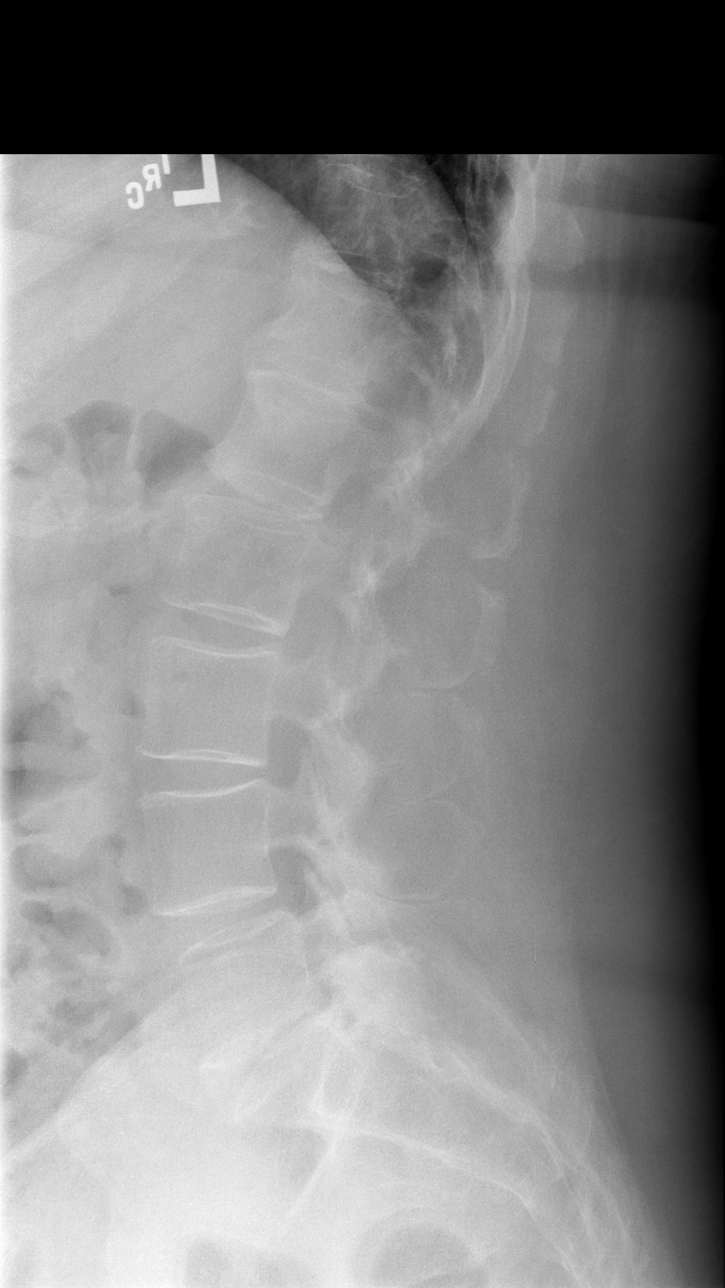

[t l-spine l5-s1 spot]
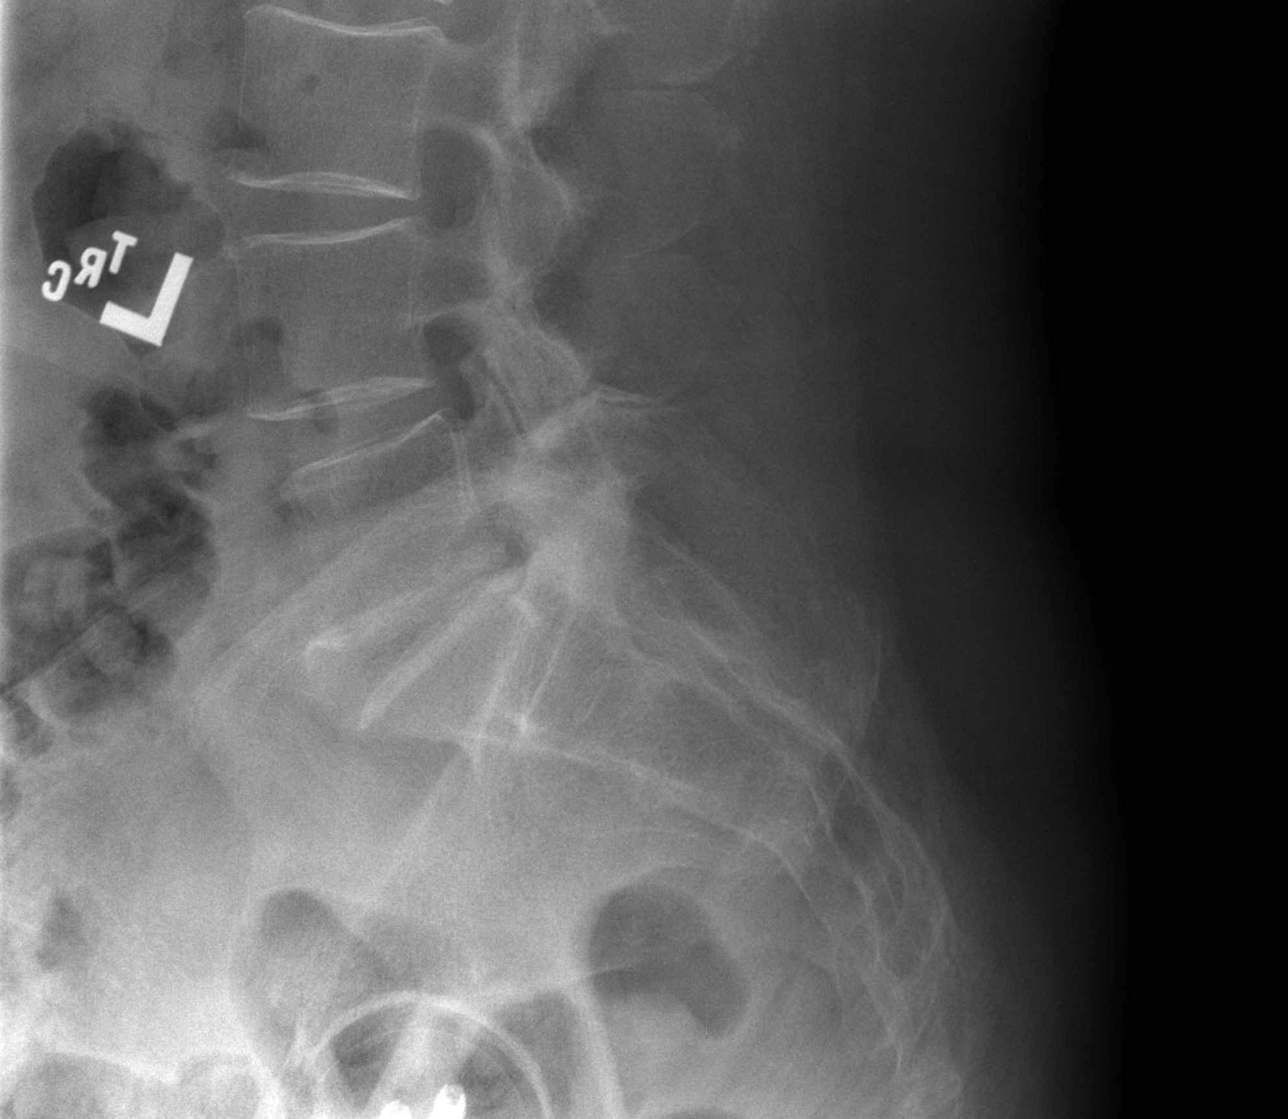

[5 of 5 positions shown; findings below may reference images not displayed]

FINDINGS: There is no evidence of lumbar spine fracture. Alignment is normal.
There is mild disc space narrowing and endplate sclerosis at L5-S1
compatible with degenerative change. Cholecystectomy clips are
present.
IMPRESSION: 1. No evidence for fracture or malalignment.
2. Mild degenerative changes at L5-S1.

## 2023-12-31 ENCOUNTER — Other Ambulatory Visit (HOSPITAL_COMMUNITY): Payer: Self-pay | Admitting: Family Medicine

## 2023-12-31 DIAGNOSIS — Z13220 Encounter for screening for lipoid disorders: Secondary | ICD-10-CM

## 2024-01-09 ENCOUNTER — Ambulatory Visit (HOSPITAL_BASED_OUTPATIENT_CLINIC_OR_DEPARTMENT_OTHER)
Admission: RE | Admit: 2024-01-09 | Discharge: 2024-01-09 | Disposition: A | Discharge status: Home / Self Care | Payer: Self-pay | Source: Ambulatory Visit | Attending: Family Medicine | Admitting: Family Medicine

## 2024-01-09 DIAGNOSIS — Z13220 Encounter for screening for lipoid disorders: Secondary | ICD-10-CM
